# Patient Record
Sex: Male | Born: 1995 | Race: Black or African American | Hispanic: No | Marital: Single | Smoking: Current every day smoker
Health system: Southern US, Community
[De-identification: ages and names within clinical notes are randomized; demographics above are authoritative.]

## PROBLEM LIST (undated history)

## (undated) DIAGNOSIS — R569 Unspecified convulsions: Secondary | ICD-10-CM

## (undated) DIAGNOSIS — B009 Herpesviral infection, unspecified: Secondary | ICD-10-CM

---
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
## 2015-12-24 ENCOUNTER — Other Ambulatory Visit: Payer: Self-pay

## 2015-12-24 ENCOUNTER — Emergency Department (HOSPITAL_COMMUNITY): Payer: Managed Care, Other (non HMO)

## 2015-12-24 ENCOUNTER — Emergency Department (HOSPITAL_COMMUNITY)
Admission: EM | Admit: 2015-12-24 | Discharge: 2015-12-24 | Discharge status: Against Medical Advice | Payer: Medicaid Other | Attending: Emergency Medicine | Admitting: Emergency Medicine

## 2015-12-24 ENCOUNTER — Emergency Department (HOSPITAL_COMMUNITY)
Admission: EM | Admit: 2015-12-24 | Discharge: 2015-12-25 | Disposition: A | Discharge status: Home / Self Care | Payer: Managed Care, Other (non HMO) | Attending: Emergency Medicine | Admitting: Emergency Medicine

## 2015-12-24 ENCOUNTER — Encounter (HOSPITAL_COMMUNITY): Payer: Self-pay | Admitting: Emergency Medicine

## 2015-12-24 DIAGNOSIS — Y9389 Activity, other specified: Secondary | ICD-10-CM | POA: Insufficient documentation

## 2015-12-24 DIAGNOSIS — Y92009 Unspecified place in unspecified non-institutional (private) residence as the place of occurrence of the external cause: Secondary | ICD-10-CM | POA: Diagnosis not present

## 2015-12-24 DIAGNOSIS — Y998 Other external cause status: Secondary | ICD-10-CM | POA: Diagnosis not present

## 2015-12-24 DIAGNOSIS — Z880 Allergy status to penicillin: Secondary | ICD-10-CM | POA: Insufficient documentation

## 2015-12-24 DIAGNOSIS — X58XXXA Exposure to other specified factors, initial encounter: Secondary | ICD-10-CM | POA: Diagnosis not present

## 2015-12-24 DIAGNOSIS — Y92000 Kitchen of unspecified non-institutional (private) residence as  the place of occurrence of the external cause: Secondary | ICD-10-CM | POA: Insufficient documentation

## 2015-12-24 DIAGNOSIS — R569 Unspecified convulsions: Secondary | ICD-10-CM | POA: Insufficient documentation

## 2015-12-24 DIAGNOSIS — F17210 Nicotine dependence, cigarettes, uncomplicated: Secondary | ICD-10-CM | POA: Insufficient documentation

## 2015-12-24 DIAGNOSIS — Z79899 Other long term (current) drug therapy: Secondary | ICD-10-CM | POA: Insufficient documentation

## 2015-12-24 DIAGNOSIS — W01198A Fall on same level from slipping, tripping and stumbling with subsequent striking against other object, initial encounter: Secondary | ICD-10-CM | POA: Diagnosis not present

## 2015-12-24 DIAGNOSIS — S060X1A Concussion with loss of consciousness of 30 minutes or less, initial encounter: Secondary | ICD-10-CM | POA: Diagnosis not present

## 2015-12-24 DIAGNOSIS — F1210 Cannabis abuse, uncomplicated: Secondary | ICD-10-CM | POA: Diagnosis not present

## 2015-12-24 DIAGNOSIS — S0990XA Unspecified injury of head, initial encounter: Secondary | ICD-10-CM | POA: Insufficient documentation

## 2015-12-24 DIAGNOSIS — R55 Syncope and collapse: Secondary | ICD-10-CM | POA: Diagnosis present

## 2015-12-24 DIAGNOSIS — R4020 Unspecified coma: Secondary | ICD-10-CM

## 2015-12-24 LAB — URINALYSIS, ROUTINE W REFLEX MICROSCOPIC
Bilirubin Urine: NEGATIVE
GLUCOSE, UA: NEGATIVE mg/dL
HGB URINE DIPSTICK: NEGATIVE
Ketones, ur: NEGATIVE mg/dL
Leukocytes, UA: NEGATIVE
Nitrite: NEGATIVE
PH: 7.5 (ref 5.0–8.0)
Protein, ur: NEGATIVE mg/dL
SPECIFIC GRAVITY, URINE: 1.017 (ref 1.005–1.030)

## 2015-12-24 LAB — BASIC METABOLIC PANEL
Anion gap: 10 (ref 5–15)
BUN: 17 mg/dL (ref 6–20)
CO2: 29 mmol/L (ref 22–32)
Calcium: 9.5 mg/dL (ref 8.9–10.3)
Chloride: 101 mmol/L (ref 101–111)
Creatinine, Ser: 0.88 mg/dL (ref 0.61–1.24)
GFR calc Af Amer: >60 mL/min (ref >60)
GFR calc non Af Amer: >60 mL/min (ref >60)
Glucose, Bld: 103 mg/dL — ABNORMAL HIGH (ref 65–99)
Potassium: 4.2 mmol/L (ref 3.5–5.1)
Sodium: 140 mmol/L (ref 135–145)

## 2015-12-24 LAB — RAPID URINE DRUG SCREEN, HOSP PERFORMED
AMPHETAMINES: NOT DETECTED
Barbiturates: NOT DETECTED
Benzodiazepines: NOT DETECTED
Cocaine: NOT DETECTED
OPIATES: NOT DETECTED
TETRAHYDROCANNABINOL: POSITIVE — AB

## 2015-12-24 LAB — CBC
HEMATOCRIT: 48.9 % (ref 39.0–52.0)
HEMOGLOBIN: 16.2 g/dL (ref 13.0–17.0)
MCH: 33.6 pg (ref 26.0–34.0)
MCHC: 33.1 g/dL (ref 30.0–36.0)
MCV: 101.5 fL — AB (ref 78.0–100.0)
Platelets: 160 10*3/uL (ref 150–400)
RBC: 4.82 MIL/uL (ref 4.22–5.81)
RDW: 11.8 % (ref 11.5–15.5)
WBC: 9.3 10*3/uL (ref 4.0–10.5)

## 2015-12-24 LAB — ETHANOL

## 2015-12-24 NOTE — ED Notes (Addendum)
Per EMS: Pt sisters called 911.  Pt was arguing with his girlfriend.  Said he "couldn't see".  They heard a thump and saw him lying on the floor in the kitchen with his eyes closed.  Pt states he had a headache before he fell.  Pt had a purposeful response upon EMS arrival, even though he was not talking.  Orthostatics were negative.  Pt was ambulatory to stretcher.

## 2015-12-24 NOTE — ED Provider Notes (Signed)
CSN: [id number]     Arrival date & time 12/24/15  1735 History   First MD Initiated Contact with Patient 12/24/15 2045     Chief Complaint  Patient presents with  . Loss of Consciousness     (Consider location/radiation/quality/duration/timing/severity/associated sxs/prior Treatment) HPI   Patient is a 20 year old male with no past medical history presents to the ED via EMS is status post syncopal episode. Patient reports he was at home on the phone when he began to have a right-sided headache and notes he started to have white spots in his vision and then felt lightheaded. Patient's sister was present when this is going on and she reported to the patient's mother that she saw the patient fall, hit his head and then began shaking his head and arms for approximately 15 minutes. During this time patient was unresponsive resulting in his sister calling EMS. On EMS arrival he had purposeful response but they report he was not verbal. Patient states the next thing he remembered is waking up in the ambulance and not knowing what happened. Patient denies history of similar episodes. He reports having a continued left-sided headache that he describes as a constant throbbing pain, denies any aggravating or alleviating factors. Endorses associated nausea and intermittent lightheadedness. He notes his vision has since returned to baseline. Patient states he has had migraines in the past and notes that this headache feels similar to migraines he has had before. Denies fever, chills, photophobia, dizziness, cough, difficulty breathing, chest pain, palpitations, abdominal pain, vomiting, diarrhea, urinary symptoms, numbness, tingling, weakness, neck stiffness, neck/back pain. Patient endorses smoking marijuana but reports he last smoked a few weeks ago. He also endorses drinking alcohol occasionally, denies any other drug use. Denies taking any medications at home. Denies history of seizures.  No past medical history  on file. No past surgical history on file. No family history on file. Social History  Substance Use Topics  . Smoking status: Current Some Day Smoker    Types: Cigarettes  . Smokeless tobacco: Not on file  . Alcohol Use: Yes     Comment: "Occasionally," up to 1-2 times every couple of months    Review of Systems  Gastrointestinal: Positive for nausea.  Neurological: Positive for seizures and headaches.      Allergies  Amoxicillin; Penicillins; and Sulfa antibiotics  Home Medications   Prior to Admission medications   Medication Sig Start Date End Date Taking? Authorizing Provider  diphenhydrAMINE (BENADRYL ALLERGY) 25 mg capsule Take 25 mg by mouth every 6 (six) hours as needed for itching or allergies.    Historical Provider, MD  OVER THE COUNTER MEDICATION Take 1 tablet by mouth daily. Store brand allergy relief    Historical Provider, MD   BP 124/77 mmHg  Pulse 69  Temp(Src) 97.8 F (36.6 C) (Oral)  Resp 19  SpO2 100% Physical Exam  Constitutional: He is oriented to person, place, and time. He appears well-developed and well-nourished. No distress.  HENT:  Head: Normocephalic and atraumatic. Head is without raccoon's eyes, without Battle's sign, without abrasion, without contusion and without laceration.  Right Ear: Tympanic membrane normal. No hemotympanum.  Left Ear: Tympanic membrane normal. No hemotympanum.  Nose: Nose normal. No septal deviation. Right sinus exhibits no maxillary sinus tenderness and no frontal sinus tenderness. Left sinus exhibits no maxillary sinus tenderness and no frontal sinus tenderness.  Mouth/Throat: Uvula is midline, oropharynx is clear and moist and mucous membranes are normal. No oropharyngeal exudate.  No bite marks  on tongue.  Eyes: Conjunctivae and EOM are normal. Pupils are equal, round, and reactive to light. Right eye exhibits no discharge. Left eye exhibits no discharge. No scleral icterus.  Neck: Normal range of motion. Neck  supple.  Cardiovascular: Normal rate, regular rhythm, normal heart sounds and intact distal pulses.   Pulmonary/Chest: Effort normal and breath sounds normal. No respiratory distress. He has no wheezes. He has no rales. He exhibits no tenderness.  Abdominal: Soft. Bowel sounds are normal. He exhibits no distension and no mass. There is no tenderness. There is no rebound and no guarding.  Musculoskeletal: Normal range of motion. He exhibits no edema or tenderness.  Lymphadenopathy:    He has no cervical adenopathy.  Neurological: He is alert and oriented to person, place, and time. He has normal strength and normal reflexes. No cranial nerve deficit or sensory deficit. He displays a negative Romberg sign. Coordination and gait normal.  Skin: Skin is warm and dry. He is not diaphoretic.  Nursing note and vitals reviewed.   ED Course  Procedures (including critical care time) Labs Review Labs Reviewed  BASIC METABOLIC PANEL - Abnormal; Notable for the following:    Glucose, Bld 103 (*)    All other components within normal limits  CBC - Abnormal; Notable for the following:    MCV 101.5 (*)    All other components within normal limits  URINE RAPID DRUG SCREEN, HOSP PERFORMED - Abnormal; Notable for the following:    Tetrahydrocannabinol POSITIVE (*)    All other components within normal limits  URINALYSIS, ROUTINE W REFLEX MICROSCOPIC (NOT AT [facility])  ETHANOL    Imaging Review Ct Head Wo Contrast  12/24/2015  CLINICAL DATA:  Pt was arguing with his girlfriend. Said he "couldn't see". They heard a thump and saw him lying on the floor in the kitchen with his eyes closed. Pt states he had a headache before he fell. Pt had a purposeful response upon .*comment was truncated* EXAM: CT HEAD WITHOUT CONTRAST TECHNIQUE: Contiguous axial images were obtained from the base of the skull through the vertex without intravenous contrast. COMPARISON:  None. FINDINGS: No mass lesion. No midline shift. No  acute hemorrhage or hematoma. No extra-axial fluid collections. No evidence of acute infarction. Calvarium intact. Visualized portions of the sinuses clear. IMPRESSION: Normal head CT Electronically Signed   By: Esperanza Heir M.D.   On: 12/24/2015 21:57   I have personally reviewed and evaluated these images and lab results as part of my medical decision-making.   EKG Interpretation   Date/Time:  Friday December 24 2015 17:01:26 EST Ventricular Rate:  83 PR Interval:  169 QRS Duration: 98 QT Interval:  344 QTC Calculation: 404 R Axis:   102 Text Interpretation:  Sinus rhythm Borderline right axis deviation ST  elev, probable normal early repol pattern ED PHYSICIAN INTERPRETATION  AVAILABLE IN [facility] Confirmed by TEST, Record ([id number]) on 12/25/2015  9:45:57 AM      MDM   Final diagnoses:  LOC (loss of consciousness)    Patient presents with episode of witnessed LOC by sister. Patient's sister reports she saw him on the ground shaking and was unresponsive for approximately 15 minutes. Denies history of seizures. VSS.  exam unremarkable, no evidence of injury/trauma. No neuro deficits. EKG revealed sinus rhythm with borderline right axis deviation and ST elevation likely due to normal early repolarization.  labs unremarkable.  UDS positive for tetrahydrocannabinol. CT head negative. On reexamination patient able to stand and ambulate  without assistance, no ataxia noted. I feel that pt is appropriate to be d/c home and follow up with neurology out pt. Orders placed for outpatient neuro follow up.   Evaluation does not show pathology requring ongoing emergent intervention or admission. Pt is hemodynamically stable and mentating appropriately. Discussed findings/results and plan with patient/guardian, who agrees with plan. All questions answered. Return precautions discussed and outpatient follow up given.    Satira Sark [place], [place] 12/27/15 0115  Marily Memos, MD 12/29/15  [phone]

## 2015-12-24 NOTE — ED Notes (Signed)
Pt just left AMA about 30 mins ago.  Was brought back by his family to be examined.  They report a syncopal episode after pt argued with his girlfriend on the phone.  Pt c/o headache.  Pt was ambulatory to triage and is tearful d/t fear.

## 2015-12-24 NOTE — ED Notes (Signed)
[facility]

## 2015-12-24 NOTE — ED Notes (Signed)
Pt states "he's ready to get the fuck up outta here." Pt showed the door.

## 2015-12-24 NOTE — Discharge Instructions (Signed)
Please follow up with a primary care provider from the Resource Guide provided below in 2-3 days. Please return to the Emergency Department if symptoms worsen or new onset of fever, chills, headache, visual changes, lightheadedness, dizziness, difficulty breathing, chest pain, abdominal pain, vomiting, numbness, tingling, weakness, seizure, syncope.    Emergency Department Resource Guide 1) Find a Doctor and Pay Out of Pocket Although you won't have to find out who is covered by your insurance plan, it is a good idea to ask around and get recommendations. You will then need to call the office and see if the doctor you have chosen will accept you as a new patient and what types of options they offer for patients who are self-pay. Some doctors offer discounts or will set up payment plans for their patients who do not have insurance, but you will need to ask so you aren't surprised when you get to your appointment.  2) Contact Your Local Health Department Not all health departments have doctors that can see patients for sick visits, but many do, so it is worth a call to see if yours does. If you don't know where your local health department is, you can check in your phone book. The CDC also has a tool to help you locate your state's health department, and many state websites also have listings of all of their local health departments.  3) Find a Walk-in Clinic If your illness is not likely to be very severe or complicated, you may want to try a walk in clinic. These are popping up all over the country in pharmacies, drugstores, and shopping centers. They're usually staffed by nurse practitioners or physician assistants that have been trained to treat common illnesses and complaints. They're usually fairly quick and inexpensive. However, if you have serious medical issues or chronic medical problems, these are probably not your best option.  No Primary Care Doctor: - Call Health Connect at  [phone] -  they can help you locate a primary care doctor that  accepts your insurance, provides certain services, etc. - Physician Referral Service- [phone]  Chronic Pain Problems: Organization         Address  Phone   Notes  [facility]  [phone] Patients need to be referred by their primary care doctor.   Medication Assistance: Organization         Address  Phone   Notes  [facility] Medication [facility] [address] [place], [place] [address] [phone] --Must be a resident of [facility] -- Must have NO insurance coverage whatsoever (no Medicaid/ Medicare, etc.) -- The pt. MUST have a primary care doctor that directs their care regularly and follows them in the community   [facility]ist  [phone]   [employer]  [phone]    Agencies that provide inexpensive medical care: Organization         Address  Phone   Notes  [facility]  [phone]   [facility]    [phone]   [facility] [address] [place], [place] [address] [phone]   [facility] [address] [place] [place] [phone]   Planned Parenthood    [phone]   [facility]    [phone]   [facility] and [facility]  [address], [place] Phone:  [phone], Fax:  [phone] Hours of Operation:  9  am - 6 pm, M-F.  Also accepts Medicaid/Medicare and self-pay.  [facility]  [address], [place] Phone: [phone]) [phone], Fax: [phone]. Hours of Operation:  8:30 am - 5:30 pm, M-F.  Also accepts Medicaid and self-pay.  [facility] [place] [address], [place] Phone: [phone]) [phone]   [facility] [address], [place], [place] [phone], Ext. [phone] Mondays & Thursdays: 7-9 AM.  First 15 patients are seen on a first come, first serve basis.    Medicaid-accepting  [facility] Providers:  Organization         Address  Phone   Notes  [facility] [address], [place] [phone] Also accepts self-pay patients.  [facility] [address], [place]  [phone]) [phone]   [facility] [address], [place] [phone]) [phone]   [facility] [address] [place] [address], [place] [phone]   Renaye RakersVeita Bland [address], [place]   [phone]) [phone] Only accepts [place] Access [place] patients after they have their name applied to their card.   Self-Pay (no insurance) in [facility]:  Organization         Address  Phone   Notes  Sickle Cell Patients, [facility] [address], [place] [phone]) [phone]   [facility] [address], [place] [phone]) [phone]   [facility]  [address], [place] [phone]   [facility]/Dr. Osei-Bonsu  [address] [place] [address], [place] or [phone], [place] [phone]) [phone] Phone number for both [place] and [place] locations is the same.  [facility] [address], [place] [phone]) [phone]   [facility] [address] [place] [address], [place] or [address] [phone]   [facility] [address], [place] [phone]) [phone], phone; [phone](336) [phone], fax Sees patients 1st and 3rd Saturday of every month.  Must not qualify for public or private insurance (i.e. Medicaid, Medicare, [place] Health Choice, Veterans' Benefits)  Household income should be no more than 200% of the poverty level The clinic cannot treat you if you are pregnant or think you are pregnant  Sexually transmitted diseases are not treated at the clinic.    Dental Care: Organization         Address  Phone  Notes  [facility] Department of [facility] [address], [place] [phone] Accepts children up to age 21 who are enrolled in [place] or [place] Health Choice; pregnant women with a Medicaid card; and children who have applied for Medicaid or [place] Health Choice, but were declined, whose parents can pay a reduced fee at time of service.  [facility] of [facility] [place]  [address], [place] [phone] Accepts children up to age 721 who are enrolled in [place] or  Health Choice; pregnant women with a Medicaid card; and children who have applied for Medicaid or  Health Choice, but were declined, whose parents can pay a reduced fee at time of service.  [facility]  [address], [place] [phone]) [phone] Patients are seen by appointment only. Walk-ins are not accepted. [facility] will see patients 18 years of  age and older. Monday - Tuesday (8am-5pm) Most Wednesdays (8:30-5pm) $30 per visit, cash only  [facility] Adult [facility] Access PRO[facility]  [address], [facility] [phone] Patients are seen by appointment only. Walk-ins are not accepted. [facility] will see patients 38 years of age and older. One Wednesday Evening (Monthly: Volunteer Based).  $30 per visit, cash only  [employer]  [phone] for adults; Children under age 68, call Graduate Pediatric Dentistry at [phone]. Children aged 52-14, please call [phone] to request a pediatric application.  Dental services are provided in all areas of dental care including fillings, crowns and bridges, complete and partial dentures, implants, gum treatment, root canals, and extractions. Preventive care is also provided. Treatment is provided to both adults and children. Patients are selected via a lottery and there is often a waiting list.   [facility] [address] [place]  [phone] [email or web address]   [facility] [address] [place], [place] [phone], Ext. [phone] Second and Fourth Thursday of each month, opens at 6:30 AM; Clinic ends at 9 AM.  Patients are seen on a first-come first-served basis, and a limited number are seen during each clinic.   [facility]  [address] [place], [place] [phone]   Eligibility Requirements You must have lived in [place], [place], or [place] counties for at least the last three months.   You cannot be eligible for state or federal sponsored [place], including [employer], [place], or [employer].   You generally cannot be eligible for healthcare insurance through your employer.    How to apply: Eligibility screenings are held every Tuesday and Wednesday afternoon from 1:00 pm until 4:00 pm. You do not need an appointment for the interview!  [facility] [address] [place], [place] [phone]   [facility] Health Department  [phone]   [facility] Health Department  [phone]   [facility] Health Department  [phone]    Behavioral Health Resources in the Community: Intensive Outpatient Programs Organization         Address  Phone  Notes  [facility] [address] [place], [place] [phone]   [facility]patient [address], [place], [place] [phone]   [facility]: [facility] [address] [place], [place]  [phone]   [facility] [address],  [place], [place] [phone] or [phone]   Substance Abuse Resources Organization         Address  Phone  Notes  Alcohol [facility] Drug Services  [phone]   [facility]  [phone]   [facility] [place]  [phone]   Floydene Flock  [phone]   Residential & Outpatient Substance Abuse Program  [phone]   Psychological Services Organization         Address  Phone  Notes  [facility]   [phone]   [facility]  [phone]   [facility] [address], [place] [phone] or [phone]    Mobile Crisis Teams Organization         Address  Phone  Notes  [facility], Mobile Crisis Care Unit  [phone]   [facility]  [address] [place], [place] [phone]   [facility] [address] [place] [phone]    Self-Help/Support Groups Organization  Address  Phone             Notes  [place] of [place] - variety of support groups  [place] Call for more information  Narcotics Anonymous (NA), [facility] [address], [employer] [place]  2 meetings at this location   Special educational needs teacher         Address  Phone  Notes  [facility] [place],    [place]  [phone]   [facility]  [address] [place] [phone], [place], [place]   [place], [place] [phone] Admissions: 8am-3pm M-F  Incentives Substance [place] [address],    [place], [place] [phone]   [facility] [address] [place], [place], [place]   [facility] Alejandro Otero Lopez [address]  [place], [place]   [facility] - Intensive Outpatient [address], [place], [place]   [facility] [address]  [place], [place] [phone] or [phone]   [facility] ([facility]) [address] [place], [place] Accepts Medicaid  [facility] [address]  [place] [phone] Substance Abuse/Addiction Treatment   [facility] Organization         Address  Phone  Notes  [facility]  [phone]   Domenic Schwab, PhD [address] Arlis Porta [place], [place]   [phone] or  [phone]   [place], [place] [phone]   [facility] [address] [place], [place] [phone] Insurance/Medicaid/sponsorship through [facility] and Families [address], [address] [place]                                    [place], [place] [phone] [place] [address], [place] [phone]    Dr. Adele Schilder  [phone]   [facility] Dept. 1) [address], [place] 2) [place] 3)  [place] [address], [place] [phone]  [phone]   [place] [phone] or [phone] (After Hours)

## 2016-01-24 ENCOUNTER — Ambulatory Visit (INDEPENDENT_AMBULATORY_CARE_PROVIDER_SITE_OTHER): Payer: Managed Care, Other (non HMO) | Admitting: Neurology

## 2016-01-24 ENCOUNTER — Encounter: Payer: Self-pay | Admitting: Neurology

## 2016-01-24 VITALS — BP 120/64 | HR 85 | Ht 68.0 in | Wt 172.0 lb

## 2016-01-24 DIAGNOSIS — G40001 Localization-related (focal) (partial) idiopathic epilepsy and epileptic syndromes with seizures of localized onset, not intractable, with status epilepticus: Secondary | ICD-10-CM

## 2016-01-24 DIAGNOSIS — G43109 Migraine with aura, not intractable, without status migrainosus: Secondary | ICD-10-CM

## 2016-01-24 MED ORDER — LAMOTRIGINE ER 25 MG PO TB24: ORAL_TABLET | ORAL | Status: DC

## 2016-01-24 NOTE — Patient Instructions (Signed)
1.  Lamotrigine XR (Lamictal)  Week 1&2:  daily Week 3&4:  daily Week 5:      daily  At the beginning of week 5, call the clinic, so we can call in a prescription to further increase the dose to an appropriate level.  If you experience any new or unusual rash while taking this medication, call the clinic immediately.  There is a small risk of suicidal ideation, which is the case for all antiepileptic medications.  2.  No driving.  As per [place] law, you should not drive for 6 months since last seizure. 3.  MRI of brain with and without contrast with seizure protocol 4.  Sleep deprived EEG 5.  Follow up in 6 months.

## 2016-01-24 NOTE — [place] Notes (Signed)
Chart forwarded.  

## 2016-01-24 NOTE — [place] Notes (Signed)
NEUROLOGY CONSULTATION NOTE  Walter Sparks MRN: [id number] DOB: October 31, 1996  Referring provider: Marily Memos, MD (ED referral) Primary care provider: Rodrigo Ran, MD  Reason for consult:  Seizure, loss of consciousness  HISTORY OF PRESENT ILLNESS: Walter Sparks is a 20 year old right-handed male who presents for possible seizure.  History obtained by patient and ED note.  Labs and imaging of head CT reviewed.  On 12/24/15, he was at home on the phone, when he developed sudden right sided headache and saw bright tunnel vision, followed by lightheadedness and chest tightness.  The patient then fell, hitting his head and he began shaking his head and arms on the floor for about 10 minutes.  He did not have incontinence but said he did bite the side of his tongue without blood.  This was witnessed by his sister and mother.  He vaguely remembers EMS walking him into the ambulance but didn't fully wake up until inside the ambulance.  [facility].  He reported possible sleep deprivation.  CT of head was normal.  Urine drug screen was positive for THC.  Ethanol was negative.  CBC and BMP were unremarkable.  EKG showed sinus rhythm.  He was discharged with outpatient neurology follow up.  He reports two other episodes of loss of consciousness, but they were more brief, about 2 to 3 minutes, and he did not have prolonged lethargy afterwards. He sometimes has right sided headache with transient blurred vision in the right eye.  He reports no known history of seizures.  He had an uncomplicated vaginal birth.  He denies history of febrile seizures or meningitis or encephalitis.  He had a concussion with loss of consciousness once while playing football.  There is no known family history of seizures.  PAST MEDICAL HISTORY: History reviewed. No pertinent past medical history.  PAST SURGICAL HISTORY: History reviewed. No pertinent past surgical history.  MEDICATIONS: Current  Outpatient Prescriptions on File Prior to Visit  Medication Sig Dispense Refill  . diphenhydrAMINE (BENADRYL ALLERGY) 25 mg capsule Take 25 mg by mouth every 6 (six) hours as needed for itching or allergies.     No current facility-administered medications on file prior to visit.    ALLERGIES: Allergies  Allergen Reactions  . Amoxicillin Hives  . Penicillins Hives  . Sulfa Antibiotics Hives    FAMILY HISTORY: History reviewed. No pertinent family history.  SOCIAL HISTORY: Social History   Social History  . Marital Status: Single    Spouse Name: N/A  . Number of Children: N/A  . Years of Education: N/A   Occupational History  . Not on file.   Social History Main Topics  . Smoking status: Current Some Day Smoker    Types: Cigarettes  . Smokeless tobacco: Not on file  . Alcohol Use: Yes     Comment: "Occasionally," up to 1-2 times every couple of months  . Drug Use: 7.00 per week    Special: Marijuana     Comment: Daily marijuana use with tobacco  . Sexual Activity:    Partners: Female     Comment: Uses condoms inconsistently   Other Topics Concern  . Not on file   Social History Narrative    REVIEW OF SYSTEMS: Constitutional: No fevers, chills, or sweats, no generalized fatigue, change in appetite Eyes: No visual changes, double vision, eye pain Ear, nose and throat: No hearing loss, ear pain, nasal congestion, sore throat Cardiovascular: No chest pain, palpitations Respiratory:  No shortness of breath at rest or with exertion, wheezes GastrointestinaI: No nausea, vomiting, diarrhea, abdominal pain, fecal incontinence Genitourinary:  No dysuria, urinary retention or frequency Musculoskeletal:  No neck pain, back pain Integumentary: No rash, pruritus, skin lesions Neurological: as above Psychiatric: No depression, insomnia, anxiety Endocrine: No palpitations, fatigue, diaphoresis, mood swings, change in appetite, change in weight, increased  thirst Hematologic/Lymphatic:  No anemia, purpura, petechiae. Allergic/Immunologic: no itchy/runny eyes, nasal congestion, recent allergic reactions, rashes  PHYSICAL EXAM: Filed Vitals:   01/24/16 0858  BP: 120/64  Pulse: 85   General: No acute distress.  Patient appears well-groomed.  Head:  Normocephalic/atraumatic Eyes:  fundi unremarkable, without vessel changes, exudates, hemorrhages or papilledema. Neck: supple, no paraspinal tenderness, full range of motion Back: No paraspinal tenderness Heart: regular rate and rhythm Lungs: Clear to auscultation bilaterally. Vascular: No carotid bruits. Neurological Exam: Mental status: alert and oriented to person, place, and time, recent and remote memory intact, fund of knowledge intact, attention and concentration intact, speech fluent and not dysarthric, language intact. Cranial nerves: CN I: not tested CN II: pupils equal, round and reactive to light, visual fields intact, fundi unremarkable, without vessel changes, exudates, hemorrhages or papilledema. CN III, IV, VI:  full range of motion, no nystagmus, no ptosis CN V: facial sensation intact CN VII: upper and lower face symmetric CN VIII: hearing intact CN IX, X: gag intact, uvula midline CN XI: sternocleidomastoid and trapezius muscles intact CN XII: tongue midline Bulk & Tone: normal, no fasciculations. Motor:  5/5 throughout Sensation:  Pinprick and vibration sensation intact. Deep Tendon Reflexes:  2+ throughout, toes downgoing.  Finger to nose testing:  Without dysmetria.  Heel to shin:  Without dysmetria.  Gait:  Normal station and stride.  Able to turn and tandem walk. Romberg negative.  IMPRESSION: With prolonged loss of consciousness and  lethargy afterwards, I favor seizure over convulsive syncope. He probably has migraine with aura as well  PLAN: 1.  Will initiate Lamictal XR titration.  He will call in 5 weeks for refill or continued titration 2.  Discussed [place]  law - no driving for 6 months from time of seizure 3.  MRI of brain with and without contrast with seizure protocol 4.  Sleep deprived EEG 5.  Follow up in 5 months.  Thank you for allowing me to take part in the care of this patient.  Shon Millet, DO  CC:  Rodrigo Ran, MD

## 2016-02-01 ENCOUNTER — Ambulatory Visit (HOSPITAL_COMMUNITY): Admission: RE | Admit: 2016-02-01 | Payer: Managed Care, Other (non HMO) | Source: Ambulatory Visit

## 2016-02-03 ENCOUNTER — Other Ambulatory Visit: Payer: Managed Care, Other (non HMO) | Admitting: Neurology

## 2016-06-22 ENCOUNTER — Ambulatory Visit: Payer: Managed Care, Other (non HMO) | Admitting: Neurology

## 2016-06-27 ENCOUNTER — Ambulatory Visit: Payer: Managed Care, Other (non HMO) | Admitting: Neurology

## 2016-06-30 ENCOUNTER — Encounter: Payer: Self-pay | Admitting: Neurology

## 2016-07-05 ENCOUNTER — Telehealth: Payer: Self-pay | Admitting: Neurology

## 2016-07-05 NOTE — Telephone Encounter (Signed)
Dismiss[facility] from [facility] by Alma Downsr.Adam Jaffe 06/30/16.fbg

## 2016-07-11 NOTE — Telephone Encounter (Signed)
Received signed domestic return receipt verifying delivery of certified letter on July 06, 2016. Article number [phone] [phone] [id number]

## 2016-09-15 ENCOUNTER — Emergency Department (HOSPITAL_COMMUNITY)
Admission: EM | Admit: 2016-09-15 | Discharge: 2016-09-15 | Disposition: A | Discharge status: Home / Self Care | Payer: Medicaid Other | Attending: Emergency Medicine | Admitting: Emergency Medicine

## 2016-09-15 ENCOUNTER — Emergency Department (HOSPITAL_COMMUNITY): Payer: Medicaid Other

## 2016-09-15 ENCOUNTER — Encounter (HOSPITAL_COMMUNITY): Payer: Self-pay | Admitting: *Deleted

## 2016-09-15 DIAGNOSIS — B9789 Other viral agents as the cause of diseases classified elsewhere: Secondary | ICD-10-CM

## 2016-09-15 DIAGNOSIS — F17210 Nicotine dependence, cigarettes, uncomplicated: Secondary | ICD-10-CM | POA: Insufficient documentation

## 2016-09-15 DIAGNOSIS — R05 Cough: Secondary | ICD-10-CM | POA: Diagnosis present

## 2016-09-15 DIAGNOSIS — J069 Acute upper respiratory infection, unspecified: Secondary | ICD-10-CM | POA: Diagnosis not present

## 2016-09-15 MED ORDER — PROMETHAZINE-DM 6.25-15 MG/5ML PO SYRP: 5.0000 mL | ORAL_SOLUTION | Freq: Four times a day (QID) | ORAL | 0 refills | Status: DC | PRN

## 2016-09-15 MED ORDER — GUAIFENESIN 100 MG/5ML PO LIQD: 100.0000 mg | ORAL | 0 refills | Status: DC | PRN

## 2016-09-15 NOTE — ED Provider Notes (Signed)
[facility] Provider Note   CSN: [id number] Arrival date & time: 09/15/16  1443  By signing my name below, I, Walter Sparks, attest that this documentation has been prepared under the direction and in the presence of non-physician practitioner, Fayrene HelperBowie Will Heinkel, PA-C. Electronically Signed: Majel HomerPeyton Sparks, Scribe. 09/15/2016. 4:29 PM.  History   Chief Complaint Chief Complaint  Patient presents with  . Cough   The history is provided by the patient. No language interpreter was used.   HPI Comments: Walter Sparks is a 20 y.o. male who presents to the Emergency Department complaining of gradually worsening, productive cough and sore throat that began 3 days ago and worsened today. Pt reports his cough is worse in the morning after waking up and is accompanied with shortness of breath. He notes associated rhinorrhea, congestion, sneezing and diaphoresis. Pt states he has been drinking hot tea but has not taken any medication to relieve his symptoms. He deneis fever, recent surgery, hx of PE or asthma, recent long travel, calf pain and leg swelling. No environmental changes.  Pt believes blood in his sputum is likely due to prolonged coughing.  History reviewed. No pertinent past medical history.  Patient Active Problem List   Diagnosis Date Noted  . Localization-related idiopathic epilepsy and epileptic syndromes with seizures of localized onset, not intractable, with status epilepticus (HCC) 01/24/2016  . Possible exposure to STD 10/08/2013  . Tobacco abuse counseling 10/08/2013  . ATTENTION DEFICIT DISORDER 07/07/2008    History reviewed. No pertinent surgical history.   Home Medications    Prior to Admission medications   Medication Sig Start Date End Date Taking? Authorizing Provider  diphenhydrAMINE (BENADRYL ALLERGY) 25 mg capsule Take 25 mg by mouth every 6 (six) hours as needed for itching or allergies.    Historical Provider, MD  LamoTRIgine (LAMICTAL XR) 25 MG TB24 tablet Week  1 &25: 25 mg daily, Week 3&4: 50 mg daily, Week 5: 100 mg 01/24/16   Drema DallasAdam R Jaffe, DO    Family History No family history on file.  Social History Social History  Substance Use Topics  . Smoking status: Current Some Day Smoker    Types: Cigarettes  . Smokeless tobacco: Never Used  . Alcohol use Yes     Comment: "Occasionally," up to 1-2 times every couple of months     Allergies   Amoxicillin; Penicillins; and Sulfa antibiotics   Review of Systems Review of Systems  Constitutional: Positive for diaphoresis. Negative for fever.  HENT: Positive for congestion, rhinorrhea, sneezing and sore throat.   Respiratory: Positive for cough and shortness of breath.   Cardiovascular: Negative for leg swelling.   Physical Exam Updated Vital Signs BP 147/71 (BP Location: Left Arm)   Pulse 88   Temp 98.7 F (37.1 C) (Oral)   Resp 17   Ht 5\' 11"  (1.803 m)   Wt 170 lb (77.1 kg)   SpO2 96%   BMI 23.71 kg/m   Physical Exam  Constitutional: He is oriented to person, place, and time. He appears well-developed and well-nourished.  HENT:  Head: Normocephalic and atraumatic.  Mild rhinorrhea, throat is clear.   Eyes: EOM are normal.  Neck: Normal range of motion.  Cardiovascular: Normal rate, regular rhythm, normal heart sounds and intact distal pulses.   Pulmonary/Chest: Effort normal and breath sounds normal. No respiratory distress.  Abdominal: Soft. He exhibits no distension. There is no tenderness.  Musculoskeletal: Normal range of motion.  BLE: no palpable cords, erythema, or edema  Neurological: He is alert and oriented to person, place, and time.  Skin: Skin is warm and dry.  Psychiatric: He has a normal mood and affect. Judgment normal.  Nursing note and vitals reviewed.  ED Treatments / Results  Labs (all labs ordered are listed, but only abnormal results are displayed) Labs Reviewed - No data to display  EKG  EKG Interpretation None       Radiology Dg Chest 2  View  Result Date: 09/15/2016 CLINICAL DATA:  Chest pain and shortness of breath.  Tobacco use. EXAM: CHEST  2 VIEW COMPARISON:  None. FINDINGS: Lungs are clear. Heart size and pulmonary vascularity are normal. No adenopathy. No pneumothorax. No bone lesions. IMPRESSION: No abnormality noted. Electronically Signed   By: Bretta Bang III M.D.   On: 09/15/2016 16:30   Procedures Procedures (including critical care time)  Medications Ordered in ED Medications - No data to display  DIAGNOSTIC STUDIES:  Oxygen Saturation is 96% on RA, normal by my interpretation.    COORDINATION OF CARE:  4:29 PM Discussed treatment plan with pt at bedside and pt agreed to plan.  Initial Impression / Assessment and Plan / ED Course  I have reviewed the triage vital signs and the nursing notes.  Pertinent labs & imaging results that were available during my care of the patient were reviewed by me and considered in my medical decision making (see chart for details).  Clinical Course    BP 147/71 (BP Location: Left Arm)   Pulse 88   Temp 98.7 F (37.1 C) (Oral)   Resp 17   Ht 5\' 11"  (1.803 m)   Wt 77.1 kg   SpO2 96%   BMI 23.71 kg/m    I personally performed the services described in this documentation, which was scribed in my presence. The recorded information has been reviewed and is accurate.   Final Clinical Impressions(s) / ED Diagnoses   Final diagnoses:  Viral URI with cough    New Prescriptions New Prescriptions   GUAIFENESIN (ROBITUSSIN) 100 MG/5ML LIQUID    Take 5-10 mLs (100-200 mg total) by mouth every 4 (four) hours as needed for congestion.   PROMETHAZINE-DEXTROMETHORPHAN (PROMETHAZINE-DM) 6.25-15 MG/5ML SYRUP    Take 5 mLs by mouth 4 (four) times daily as needed for cough.   4:40 PM Pt here with cough and URI sxs.  He report blood tinge sputum.  cxr negative for infection.  PERC negative, doubt PE.  Will provide sxs treatment.  Return precaution discussed.     Fayrene Helper, PA-C 09/15/16 1641    Melene Plan, DO 09/15/16 [phone]

## 2016-09-15 NOTE — ED Notes (Signed)
Pt verbalized understanding of d/c instructions and has no further questions. Pt stable and NAD.  

## 2016-09-15 NOTE — ED Triage Notes (Signed)
The pt is c/o a cough and a sorethroat   Cold for 3 days  No temp

## 2016-09-21 ENCOUNTER — Ambulatory Visit: Payer: Managed Care, Other (non HMO) | Admitting: Neurology

## 2017-01-19 ENCOUNTER — Encounter (HOSPITAL_COMMUNITY): Payer: Self-pay | Admitting: Emergency Medicine

## 2017-01-19 ENCOUNTER — Ambulatory Visit (HOSPITAL_COMMUNITY)
Admission: EM | Admit: 2017-01-19 | Discharge: 2017-01-19 | Disposition: A | Discharge status: Home / Self Care | Payer: Self-pay | Attending: Family Medicine | Admitting: Family Medicine

## 2017-01-19 DIAGNOSIS — Z880 Allergy status to penicillin: Secondary | ICD-10-CM | POA: Insufficient documentation

## 2017-01-19 DIAGNOSIS — F17210 Nicotine dependence, cigarettes, uncomplicated: Secondary | ICD-10-CM | POA: Insufficient documentation

## 2017-01-19 DIAGNOSIS — Z202 Contact with and (suspected) exposure to infections with a predominantly sexual mode of transmission: Secondary | ICD-10-CM | POA: Insufficient documentation

## 2017-01-19 DIAGNOSIS — N485 Ulcer of penis: Secondary | ICD-10-CM | POA: Insufficient documentation

## 2017-01-19 MED ORDER — ACYCLOVIR 400 MG PO TABS
400.0000 mg | ORAL_TABLET | Freq: Three times a day (TID) | ORAL | 0 refills | Status: DC
Start: 2017-01-19 — End: 2022-02-08

## 2017-01-19 NOTE — ED Triage Notes (Signed)
Here for sores on genital area onset 5 days  Denies fevers, chills, penile d/c, dysuria  SA and uses condoms all the time  A&O x4... NAD

## 2017-01-19 NOTE — ED Provider Notes (Signed)
CSN: [id number]     Arrival date & time 01/19/17  1044 History   None    Chief Complaint  Patient presents with  . Exposure to STD   (Consider location/radiation/quality/duration/timing/severity/associated sxs/prior Treatment) Patient c/o having unprotected sex recently and having genital ulcer and bump and painful lesion on penis.   The history is provided by the patient.  Exposure to STD  This is a new problem. The current episode started 2 days ago. The problem occurs constantly. The problem has not changed since onset.Nothing aggravates the symptoms.    History reviewed. No pertinent past medical history. History reviewed. No pertinent surgical history. History reviewed. No pertinent family history. Social History  Substance Use Topics  . Smoking status: Current Some Day Smoker    Types: Cigarettes  . Smokeless tobacco: Never Used  . Alcohol use Yes     Comment: "Occasionally," up to 1-2 times every couple of months    Review of Systems  Constitutional: Negative.   HENT: Negative.   Eyes: Negative.   Respiratory: Negative.   Cardiovascular: Negative.   Gastrointestinal: Negative.   Endocrine: Negative.   Genitourinary: Positive for genital sores and penile pain.  Musculoskeletal: Negative.   Allergic/Immunologic: Negative.   Neurological: Negative.   Hematological: Negative.   Psychiatric/Behavioral: Negative.     Allergies  Amoxicillin; Penicillins; and Sulfa antibiotics  Home Medications   Prior to Admission medications   Medication Sig Start Date End Date Taking? Authorizing Provider  acyclovir (ZOVIRAX) 400 MG tablet Take 1 tablet (400 mg total) by mouth 3 (three) times daily. 01/19/17   Deatra Canter, FNP  diphenhydrAMINE (BENADRYL ALLERGY) 25 mg capsule Take 25 mg by mouth every 6 (six) hours as needed for itching or allergies.    Historical Provider, MD  guaiFENesin (ROBITUSSIN) 100 MG/5ML liquid Take 5-10 mLs (100-200 mg total) by mouth every 4 (four)  hours as needed for congestion. 09/15/16   Fayrene Helper, PA-C  LamoTRIgine (LAMICTAL XR) 25 MG TB24 tablet Week 1 &25: 25 mg daily, Week 3&4: 50 mg daily, Week 5: 100 mg 01/24/16   Drema Dallas, DO  promethazine-dextromethorphan (PROMETHAZINE-DM) 6.25-15 MG/5ML syrup Take 5 mLs by mouth 4 (four) times daily as needed for cough. 09/15/16   Fayrene Helper, PA-C   Meds Ordered and Administered this Visit  Medications - No data to display  BP 140/68 (BP Location: Left Arm)   Pulse 81   Temp 98.1 F (36.7 C) (Oral)   Resp 20   SpO2 100%  No data found.   Physical Exam  Constitutional: He is oriented to person, place, and time. He appears well-developed and well-nourished.  HENT:  Head: Normocephalic and atraumatic.  Eyes: Conjunctivae and EOM are normal. Pupils are equal, round, and reactive to light.  Neck: Normal range of motion. Neck supple.  Cardiovascular: Normal rate, regular rhythm and normal heart sounds.   Pulmonary/Chest: Effort normal and breath sounds normal.  Genitourinary: Penile tenderness present.  Genitourinary Comments: Painful small ulcer left penile shaft.  Neurological: He is alert and oriented to person, place, and time.  Nursing note and vitals reviewed.   Urgent Care Course     Procedures (including critical care time)  Labs Review Labs Reviewed  HSV CULTURE AND TYPING    Imaging Review No results found.   Visual Acuity Review  Right Eye Distance:   Left Eye Distance:   Bilateral Distance:    Right Eye Near:   Left Eye Near:  Bilateral Near:         MDM   1. Penile ulcer   2. Possible exposure to STD    Herpes Culture Urine cytology GC Chlamydia Trich  Acyclovir 400mg  one po tid x 7 days #21    Deatra CanterWilliam J Romonia Yanik, FNP 01/19/17 1259

## 2017-01-22 LAB — HSV CULTURE AND TYPING

## 2017-02-07 IMAGING — DX DG CHEST 2V
1 series · 1 of 1 positions shown · non-contrast
Comparison: None.

CLINICAL DATA: Chest pain and shortness of breath.  Tobacco use.

EXAM:
CHEST  2 VIEW

[chest lat]
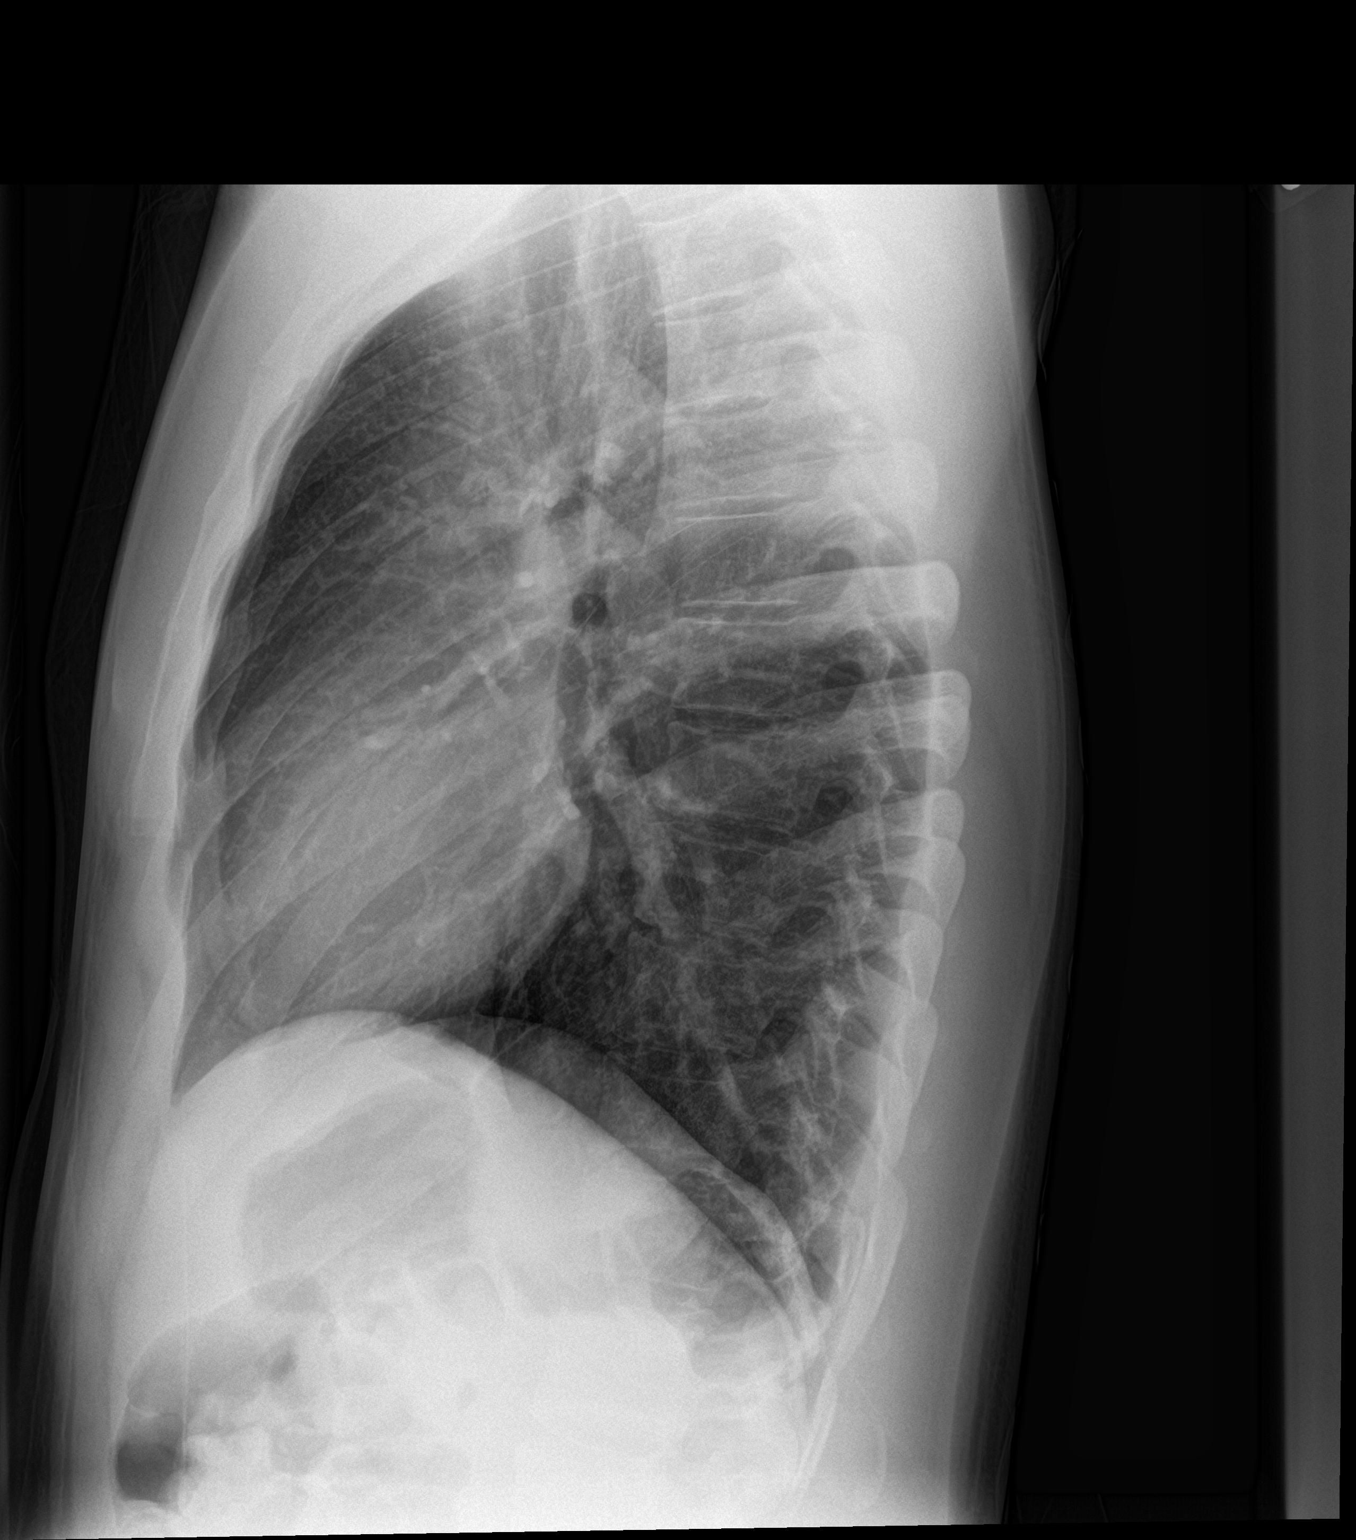

[1 of 1 positions shown; findings below may reference images not displayed]

FINDINGS: Lungs are clear. Heart size and pulmonary vascularity are normal. No
adenopathy. No pneumothorax. No bone lesions.
IMPRESSION: No abnormality noted.

## 2019-04-24 ENCOUNTER — Encounter (HOSPITAL_COMMUNITY): Payer: Self-pay

## 2019-04-24 ENCOUNTER — Other Ambulatory Visit: Payer: Self-pay

## 2019-04-24 ENCOUNTER — Ambulatory Visit (HOSPITAL_COMMUNITY)
Admission: EM | Admit: 2019-04-24 | Discharge: 2019-04-24 | Disposition: A | Discharge status: Home / Self Care | Payer: Self-pay | Attending: Internal Medicine | Admitting: Internal Medicine

## 2019-04-24 DIAGNOSIS — Z202 Contact with and (suspected) exposure to infections with a predominantly sexual mode of transmission: Secondary | ICD-10-CM | POA: Insufficient documentation

## 2019-04-24 NOTE — ED Provider Notes (Signed)
[facility]    CSN: [id number] Arrival date & time: 04/24/19  1035     History   Chief Complaint Chief Complaint  Patient presents with  . Exposure to STD    HPI Walter Sparks is a 23 y.o. male with a history of genital herpes comes to urgent care with complaints of burning sensation on micturition this morning.  Patient denies any penile discharge.  No rash on the penis.  Patient has 3 sexual partners.  Admits to unprotected sexual intercourse with 1 of the sexual partners.  His sexual partners also have other sexual partners.  No groin pain.  No testicular pain.  He has not tried any over-the-counter medications.  HPI  History reviewed. No pertinent past medical history.  Patient Active Problem List   Diagnosis Date Noted  . Localization-related idiopathic epilepsy and epileptic syndromes with seizures of localized onset, not intractable, with status epilepticus (HCC) 01/24/2016  . Possible exposure to STD 10/08/2013  . Tobacco abuse counseling 10/08/2013  . ATTENTION DEFICIT DISORDER 07/07/2008    History reviewed. No pertinent surgical history.     Home Medications    Prior to Admission medications   Medication Sig Start Date End Date Taking? Authorizing Provider  acyclovir (ZOVIRAX) 400 MG tablet Take 1 tablet (400 mg total) by mouth 3 (three) times daily. 01/19/17   Deatra Canter, FNP  diphenhydrAMINE (BENADRYL ALLERGY) 25 mg capsule Take 25 mg by mouth every 6 (six) hours as needed for itching or allergies.    [provider]  guaiFENesin (ROBITUSSIN) 100 MG/5ML liquid Take 5-10 mLs (100-200 mg total) by mouth every 4 (four) hours as needed for congestion. 09/15/16   Fayrene Helper, PA-C  promethazine-dextromethorphan (PROMETHAZINE-DM) 6.25-15 MG/5ML syrup Take 5 mLs by mouth 4 (four) times daily as needed for cough. 09/15/16   Fayrene Helper, PA-C    Family History History reviewed. No pertinent family history.  Social History Social History    Tobacco Use  . Smoking status: Current Some Day Smoker    Types: Cigarettes  . Smokeless tobacco: Never Used  Substance Use Topics  . Alcohol use: Yes    Comment: "Occasionally," up to 1-2 times every couple of months  . Drug use: Yes    Frequency: 7.0 times per week    Types: Marijuana    Comment: Daily marijuana use with tobacco     Allergies   Amoxicillin; Penicillins; and Sulfa antibiotics   Review of Systems Review of Systems  Constitutional: Negative for activity change and appetite change.  Eyes: Negative.   Respiratory: Negative.   Cardiovascular: Negative.   Gastrointestinal: Negative.   Genitourinary: Positive for dysuria and penile pain. Negative for discharge, frequency, penile swelling, scrotal swelling, testicular pain and urgency.  Musculoskeletal: Negative.   Neurological: Negative.      Physical Exam Triage Vital Signs ED Triage Vitals  Enc Vitals Group     BP 04/24/19 1052 (!) 141/88     Pulse Rate 04/24/19 1052 72     Resp --      Temp 04/24/19 1052 97.9 F (36.6 C)     Temp src --      SpO2 04/24/19 1052 100 %     Weight --      Height --      Head Circumference --      Peak Flow --      Pain Score 04/24/19 1050 3     Pain Loc --  Pain Edu? --      Excl. in GC? --    No data found.  Updated Vital Signs BP (!) 141/88   Pulse 72   Temp 97.9 F (36.6 C)   SpO2 100%   Visual Acuity Right Eye Distance:   Left Eye Distance:   Bilateral Distance:    Right Eye Near:   Left Eye Near:    Bilateral Near:     Physical Exam Constitutional:      Appearance: Normal appearance. He is not ill-appearing.  Cardiovascular:     Rate and Rhythm: Normal rate and regular rhythm.     Pulses: Normal pulses.     Heart sounds: Normal heart sounds.  Pulmonary:     Effort: Pulmonary effort is normal.     Breath sounds: Normal breath sounds.  Abdominal:     General: Abdomen is flat. There is no distension.     Palpations: Abdomen is soft.      Tenderness: There is no abdominal tenderness. There is no guarding.  Genitourinary:    Penis: Normal.      Scrotum/Testes: Normal.  Musculoskeletal: Normal range of motion.  Skin:    Capillary Refill: Capillary refill takes less than 2 seconds.  Neurological:     General: No focal deficit present.     Mental Status: He is alert and oriented to person, place, and time.      UC Treatments / Results  Labs (all labs ordered are listed, but only abnormal results are displayed) Labs Reviewed  HIV ANTIBODY (ROUTINE TESTING W REFLEX)  URINE CYTOLOGY ANCILLARY ONLY    EKG None  Radiology No results found.  Procedures Procedures (including critical care time)  Medications Ordered in UC Medications - No data to display  Initial Impression / Assessment and Plan / UC Course  I have reviewed the triage vital signs and the nursing notes.  Pertinent labs & imaging results that were available during my care of the patient were reviewed by me and considered in my medical decision making (see chart for details).     1.  Possible STD exposure: Urinalysis Urine for GC/chlamydia/trichomonas HIV testing Final Clinical Impressions(s) / UC Diagnoses   Final diagnoses:  Possible exposure to STD   Discharge Instructions   None    ED Prescriptions    None     Controlled Substance Prescriptions [place] Controlled Substance Registry consulted? Not Applicable   Merrilee JanskyLamptey, Philip O, MD 04/24/19 1126

## 2019-04-24 NOTE — ED Triage Notes (Signed)
Pt also admits to herpes outbreak and has no refills for medication

## 2019-04-24 NOTE — ED Triage Notes (Signed)
Admits to unprotected sex and started experiencing symptoms this morning

## 2019-04-25 ENCOUNTER — Telehealth (HOSPITAL_COMMUNITY): Payer: Self-pay | Admitting: Emergency Medicine

## 2019-04-25 LAB — URINE CYTOLOGY ANCILLARY ONLY
Chlamydia: POSITIVE — AB
Neisseria Gonorrhea: NEGATIVE
Trichomonas: NEGATIVE

## 2019-04-25 LAB — HIV ANTIBODY (ROUTINE TESTING W REFLEX): HIV Screen 4th Generation wRfx: NONREACTIVE

## 2019-04-25 MED ORDER — AZITHROMYCIN 250 MG PO TABS: 1000.0000 mg | ORAL_TABLET | Freq: Once | ORAL | 0 refills | Status: AC

## 2019-04-25 NOTE — Telephone Encounter (Signed)
Chlamydia is positive.  Rx po zithromax 1g #1 dose no refills was sent to the pharmacy of record.  Pt needs education to please refrain from sexual intercourse for 7 days to give the medicine time to work, sexual partners need to be notified and tested/treated.  Condoms may reduce risk of reinfection.  Recheck or followup with PCP for further evaluation if symptoms are not improving.   [facility] notified.  Patient contacted and made aware of all results, all questions answered.    

## 2019-05-13 ENCOUNTER — Ambulatory Visit (HOSPITAL_COMMUNITY)
Admission: EM | Admit: 2019-05-13 | Discharge: 2019-05-13 | Disposition: A | Discharge status: Home / Self Care | Payer: Self-pay | Attending: Family Medicine | Admitting: Family Medicine

## 2019-05-13 ENCOUNTER — Other Ambulatory Visit: Payer: Self-pay

## 2019-05-13 ENCOUNTER — Encounter (HOSPITAL_COMMUNITY): Payer: Self-pay

## 2019-05-13 DIAGNOSIS — R369 Urethral discharge, unspecified: Secondary | ICD-10-CM

## 2019-05-13 MED ORDER — CEFTRIAXONE SODIUM 250 MG IJ SOLR
INTRAMUSCULAR | Status: AC
  Filled 2019-05-13: qty 250

## 2019-05-13 MED ORDER — AZITHROMYCIN 250 MG PO TABS
ORAL_TABLET | ORAL | Status: AC
  Filled 2019-05-13: qty 4

## 2019-05-13 MED ORDER — CEFTRIAXONE SODIUM 250 MG IJ SOLR
250.0000 mg | Freq: Once | INTRAMUSCULAR | Status: AC
  Administered 2019-05-13: 13:00:00 250 mg via INTRAMUSCULAR

## 2019-05-13 MED ORDER — AZITHROMYCIN 250 MG PO TABS
1000.0000 mg | ORAL_TABLET | Freq: Once | ORAL | Status: AC
  Administered 2019-05-13: 13:00:00 1000 mg via ORAL

## 2019-05-13 MED ORDER — LIDOCAINE HCL (PF) 1 % IJ SOLN
INTRAMUSCULAR | Status: AC
  Filled 2019-05-13: qty 2

## 2019-05-13 NOTE — ED Triage Notes (Signed)
Pt presents with dysuria. Reports recently being treated for chlamydia and is curious if he needs more. Pt is now experiencing pelvic pain that comes and goes.

## 2019-05-13 NOTE — Discharge Instructions (Addendum)
We are treating you for gonorrhea and chlamydia. Lab results pending We will call with any positive results. Please refrain from sexual activity for 7 days Follow up as needed for continued or worsening symptoms

## 2019-05-14 LAB — URINE CYTOLOGY ANCILLARY ONLY
Chlamydia: POSITIVE — AB
Neisseria Gonorrhea: NEGATIVE
Trichomonas: NEGATIVE

## 2019-05-14 NOTE — ED Provider Notes (Signed)
[facility]    CSN: [id number] Arrival date & time: 05/13/19  1114     History   Chief Complaint Chief Complaint  Patient presents with  . Exposure to STD    HPI Walter Sparks is a 23 y.o. male.   Patient is a 23 year old male that presents today for exposure to STD.  He presents with dysuria.  Reports recently treated for chlamydia a couple weeks ago and has reexposed himself with sexual partner.  He has some mild pelvic pain that comes and goes.  Denies any fever, chills, body aches, night sweats.  ROS per HPI      History reviewed. No pertinent past medical history.  Patient Active Problem List   Diagnosis Date Noted  . Localization-related idiopathic epilepsy and epileptic syndromes with seizures of localized onset, not intractable, with status epilepticus (HCC) 01/24/2016  . Possible exposure to STD 10/08/2013  . Tobacco abuse counseling 10/08/2013  . ATTENTION DEFICIT DISORDER 07/07/2008    History reviewed. No pertinent surgical history.     Home Medications    Prior to Admission medications   Medication Sig Start Date End Date Taking? Authorizing Provider  acyclovir (ZOVIRAX) 400 MG tablet Take 1 tablet (400 mg total) by mouth 3 (three) times daily. 01/19/17   Deatra Canter, FNP  diphenhydrAMINE (BENADRYL ALLERGY) 25 mg capsule Take 25 mg by mouth every 6 (six) hours as needed for itching or allergies.    [provider]  guaiFENesin (ROBITUSSIN) 100 MG/5ML liquid Take 5-10 mLs (100-200 mg total) by mouth every 4 (four) hours as needed for congestion. 09/15/16   Fayrene Helper, PA-C  promethazine-dextromethorphan (PROMETHAZINE-DM) 6.25-15 MG/5ML syrup Take 5 mLs by mouth 4 (four) times daily as needed for cough. 09/15/16   Fayrene Helper, PA-C    Family History Family History  Problem Relation Age of Onset  . Healthy Mother   . Healthy Father     Social History Social History   Tobacco Use  . Smoking status: Current Some Day  Smoker    Types: Cigarettes  . Smokeless tobacco: Never Used  Substance Use Topics  . Alcohol use: Yes    Comment: "Occasionally," up to 1-2 times every couple of months  . Drug use: Yes    Frequency: 7.0 times per week    Types: Marijuana    Comment: Daily marijuana use with tobacco, reports using ecstasy      Allergies   Amoxicillin; Penicillins; and Sulfa antibiotics   Review of Systems Review of Systems  Genitourinary: Positive for discharge and dysuria. Negative for decreased urine volume, difficulty urinating, enuresis, flank pain, frequency, genital sores, hematuria, penile pain, penile swelling, scrotal swelling, testicular pain and urgency.  Musculoskeletal: Negative for arthralgias and myalgias.  Skin: Negative for rash.     Physical Exam Triage Vital Signs ED Triage Vitals  Enc Vitals Group     BP 05/13/19 1214 135/80     Pulse Rate 05/13/19 1214 68     Resp 05/13/19 1214 19     Temp 05/13/19 1214 97.9 F (36.6 C)     Temp src --      SpO2 05/13/19 1214 99 %     Weight --      Height --      Head Circumference --      Peak Flow --      Pain Score 05/13/19 1210 3     Pain Loc --      Pain Edu? --  Excl. in GC? --    No data found.  Updated Vital Signs BP 135/80   Pulse 68   Temp 97.9 F (36.6 C)   Resp 19   SpO2 99%   Visual Acuity Right Eye Distance:   Left Eye Distance:   Bilateral Distance:    Right Eye Near:   Left Eye Near:    Bilateral Near:     Physical Exam Vitals signs and nursing note reviewed.  Constitutional:      Appearance: Normal appearance.  HENT:     Head: Normocephalic and atraumatic.     Nose: Nose normal.  Eyes:     Conjunctiva/sclera: Conjunctivae normal.  Neck:     Musculoskeletal: Normal range of motion.  Pulmonary:     Effort: Pulmonary effort is normal.  Abdominal:     Palpations: Abdomen is soft.     Tenderness: There is no abdominal tenderness.  Musculoskeletal: Normal range of motion.  Skin:     General: Skin is warm and dry.  Neurological:     Mental Status: He is alert.  Psychiatric:        Mood and Affect: Mood normal.      UC Treatments / Results  Labs (all labs ordered are listed, but only abnormal results are displayed) Labs Reviewed  URINE CYTOLOGY ANCILLARY ONLY    EKG None  Radiology No results found.  Procedures Procedures (including critical care time)  Medications Ordered in UC Medications  cefTRIAXone (ROCEPHIN) injection 250 mg (250 mg Intramuscular Given 05/13/19 1314)  azithromycin (ZITHROMAX) tablet 1,000 mg (1,000 mg Oral Given 05/13/19 1315)    Initial Impression / Assessment and Plan / UC Course  I have reviewed the triage vital signs and the nursing notes.  Pertinent labs & imaging results that were available during my care of the patient were reviewed by me and considered in my medical decision making (see chart for details).     Will retreat again today for chlamydia and gonorrhea.  He was positive for chlamydia a few weeks ago but re exposed.  Instructed to restrain from sexual activity x 7 days or more.   Final Clinical Impressions(s) / UC Diagnoses   Final diagnoses:  Penile discharge     Discharge Instructions     We are treating you for gonorrhea and chlamydia. Lab results pending We will call with any positive results. Please refrain from sexual activity for 7 days Follow up as needed for continued or worsening symptoms     ED Prescriptions    None     Controlled Substance Prescriptions [place] Controlled Substance Registry consulted? Not Applicable   Janace ArisBast, Shelvy Heckert A, NP 05/14/19 1059

## 2019-05-15 ENCOUNTER — Telehealth (HOSPITAL_COMMUNITY): Payer: Self-pay | Admitting: Emergency Medicine

## 2019-05-15 NOTE — Telephone Encounter (Signed)
Chlamydia is positive.  This was treated at the urgent care visit with po zithromax 1g.  Pt needs education to please refrain from sexual intercourse for 7 days to give the medicine time to work.  Sexual partners need to be notified and tested/treated.  Condoms may reduce risk of reinfection.  Recheck or followup with PCP for further evaluation if symptoms are not improving.  [facility] notified.  Patient contacted and made aware of all results, all questions answered.   

## 2019-06-07 ENCOUNTER — Encounter (HOSPITAL_COMMUNITY): Payer: Self-pay

## 2019-06-07 ENCOUNTER — Ambulatory Visit (HOSPITAL_COMMUNITY)
Admission: EM | Admit: 2019-06-07 | Discharge: 2019-06-07 | Disposition: A | Discharge status: Home / Self Care | Payer: Self-pay | Attending: Internal Medicine | Admitting: Internal Medicine

## 2019-06-07 ENCOUNTER — Other Ambulatory Visit: Payer: Self-pay

## 2019-06-07 DIAGNOSIS — N451 Epididymitis: Secondary | ICD-10-CM | POA: Insufficient documentation

## 2019-06-07 LAB — POCT URINALYSIS DIP (DEVICE)
Bilirubin Urine: NEGATIVE
Glucose, UA: NEGATIVE mg/dL
Hgb urine dipstick: NEGATIVE
Ketones, ur: NEGATIVE mg/dL
Nitrite: NEGATIVE
Protein, ur: NEGATIVE mg/dL
Specific Gravity, Urine: 1.025 (ref 1.005–1.030)
Urobilinogen, UA: 0.2 mg/dL (ref 0.0–1.0)
pH: 7 (ref 5.0–8.0)

## 2019-06-07 MED ORDER — CEFTRIAXONE SODIUM 250 MG IJ SOLR
INTRAMUSCULAR | Status: AC
  Filled 2019-06-07: qty 250

## 2019-06-07 MED ORDER — CEFTRIAXONE SODIUM 250 MG IJ SOLR
250.0000 mg | Freq: Once | INTRAMUSCULAR | Status: AC
  Administered 2019-06-07: 13:00:00 250 mg via INTRAMUSCULAR

## 2019-06-07 MED ORDER — DOXYCYCLINE HYCLATE 100 MG PO CAPS: 100.0000 mg | ORAL_CAPSULE | Freq: Two times a day (BID) | ORAL | 0 refills | Status: DC

## 2019-06-07 MED ORDER — LIDOCAINE HCL (PF) 1 % IJ SOLN
INTRAMUSCULAR | Status: AC
Start: 2019-06-07 — End: ?
  Filled 2019-06-07: qty 2

## 2019-06-07 NOTE — ED Triage Notes (Signed)
Pt states he has pain in his testicles he has been treated for an STD and he's not sure what's going on now.

## 2019-06-07 NOTE — ED Provider Notes (Signed)
[facility]    CSN: [id number] Arrival date & time: 06/07/19  1154     History   Chief Complaint Chief Complaint  Patient presents with  . testicle pain    HPI Walter Sparks is a 23 y.o. male with no past medical history comes to urgent care complaining of dysuria and testicular pain.  Patient was recently treated for chlamydia.  He has since moved on to a new partner.  He continues to have unprotected sex.  He says that the testicular pain is persistent for most part of the day.  No relieving factors.  Aggravated by palpation.  No fever or chills.  No radiation of pain.  Occasionally he gets groin pain.  Dysuria is intermittent.  No penile discharge or rash.  No nausea vomiting.   HPI  History reviewed. No pertinent past medical history.  Patient Active Problem List   Diagnosis Date Noted  . Localization-related idiopathic epilepsy and epileptic syndromes with seizures of localized onset, not intractable, with status epilepticus (HCC) 01/24/2016  . Possible exposure to STD 10/08/2013  . Tobacco abuse counseling 10/08/2013  . ATTENTION DEFICIT DISORDER 07/07/2008    History reviewed. No pertinent surgical history.     Home Medications    Prior to Admission medications   Medication Sig Start Date End Date Taking? Authorizing Provider  acyclovir (ZOVIRAX) 400 MG tablet Take 1 tablet (400 mg total) by mouth 3 (three) times daily. 01/19/17   Deatra Canterxford, William J, FNP  diphenhydrAMINE (BENADRYL ALLERGY) 25 mg capsule Take 25 mg by mouth every 6 (six) hours as needed for itching or allergies.    [provider]  doxycycline (VIBRAMYCIN) 100 MG capsule Take 1 capsule (100 mg total) by mouth 2 (two) times daily. 06/07/19   Merrilee JanskyLamptey, Sharhonda Atwood O, MD  guaiFENesin (ROBITUSSIN) 100 MG/5ML liquid Take 5-10 mLs (100-200 mg total) by mouth every 4 (four) hours as needed for congestion. 09/15/16   Fayrene Helperran, Bowie, PA-C  promethazine-dextromethorphan (PROMETHAZINE-DM) 6.25-15 MG/5ML  syrup Take 5 mLs by mouth 4 (four) times daily as needed for cough. 09/15/16   Fayrene Helperran, Bowie, PA-C    Family History Family History  Problem Relation Age of Onset  . Healthy Mother   . Healthy Father     Social History Social History   Tobacco Use  . Smoking status: Current Some Day Smoker    Types: Cigarettes  . Smokeless tobacco: Never Used  Substance Use Topics  . Alcohol use: Yes    Comment: "Occasionally," up to 1-2 times every couple of months  . Drug use: Yes    Frequency: 7.0 times per week    Types: Marijuana    Comment: Daily marijuana use with tobacco, reports using ecstasy      Allergies   Amoxicillin, Penicillins, and Sulfa antibiotics   Review of Systems Review of Systems  Constitutional: Negative for activity change, appetite change, chills, fatigue and fever.  HENT: Negative.   Respiratory: Negative.   Gastrointestinal: Negative for abdominal pain, diarrhea, nausea, rectal pain and vomiting.  Genitourinary: Positive for difficulty urinating, discharge, dysuria, frequency, penile pain and testicular pain. Negative for enuresis, flank pain, genital sores, hematuria, penile swelling, scrotal swelling and urgency.  Musculoskeletal: Negative for arthralgias and myalgias.  Skin: Negative.   Neurological: Negative for dizziness, weakness, numbness and headaches.     Physical Exam Triage Vital Signs ED Triage Vitals  Enc Vitals Group     BP 06/07/19 1245 (!) 168/85     Pulse Rate  06/07/19 1245 90     Resp 06/07/19 1245 18     Temp 06/07/19 1245 98.8 F (37.1 C)     Temp Source 06/07/19 1245 Oral     SpO2 06/07/19 1245 98 %     Weight 06/07/19 1246 180 lb (81.6 kg)     Height --      Head Circumference --      Peak Flow --      Pain Score --      Pain Loc --      Pain Edu? --      Excl. in [place]? --    No data found.  Updated Vital Signs BP (!) 168/85 (BP Location: Right Arm)   Pulse 90   Temp 98.8 F (37.1 C) (Oral)   Resp 18   Wt 81.6 kg    SpO2 98%   BMI 25.10 kg/m   Visual Acuity Right Eye Distance:   Left Eye Distance:   Bilateral Distance:    Right Eye Near:   Left Eye Near:    Bilateral Near:     Physical Exam Eyes:     Conjunctiva/sclera: Conjunctivae normal.  Neck:     Musculoskeletal: Normal range of motion and neck supple.  Cardiovascular:     Rate and Rhythm: Normal rate and regular rhythm.     Pulses: Normal pulses.     Heart sounds: Normal heart sounds.  Pulmonary:     Effort: Pulmonary effort is normal. No respiratory distress.     Breath sounds: Normal breath sounds. No stridor. No wheezing or rales.  Abdominal:     General: Abdomen is flat.     Palpations: Abdomen is soft.  Genitourinary:    Penis: Normal.      Scrotum/Testes: Normal.     Rectum: Guaiac result negative.     Comments: Tenderness of the epididymis on palpation. Musculoskeletal: Normal range of motion.  Skin:    Capillary Refill: Capillary refill takes less than 2 seconds.  Neurological:     General: No focal deficit present.     Mental Status: He is oriented to person, place, and time.      UC Treatments / Results  Labs (all labs ordered are listed, but only abnormal results are displayed) Labs Reviewed  POCT URINALYSIS DIP (DEVICE) - Abnormal; Notable for the following components:      Result Value   Leukocytes,Ua TRACE (*)    All other components within normal limits  URINE CYTOLOGY ANCILLARY ONLY    EKG None  Radiology No results found.  Procedures Procedures (including critical care time)  Medications Ordered in UC Medications  cefTRIAXone (ROCEPHIN) injection 250 mg (250 mg Intramuscular Given 06/07/19 1318)  cefTRIAXone (ROCEPHIN) 250 MG injection (has no administration in time range)  lidocaine (PF) (XYLOCAINE) 1 % injection (has no administration in time range)    Initial Impression / Assessment and Plan / UC Course  I have reviewed the triage vital signs and the nursing notes.  Pertinent labs  & imaging results that were available during my care of the patient were reviewed by me and considered in my medical decision making (see chart for details).     1.  Acute epididymoorchitis: Ceftriaxone 250 mg IM Doxycycline 100 mg twice daily for 10 days Urine analysis is negative for urinary tract infection Urine will be sent for GC chlamydia/trichomonas Patient is educated on safe sex practices  Final Clinical Impressions(s) / UC Diagnoses   Final diagnoses:  Epididymitis,  bilateral   Discharge Instructions   None    ED Prescriptions    Medication Sig Dispense Auth. Provider   doxycycline (VIBRAMYCIN) 100 MG capsule Take 1 capsule (100 mg total) by mouth 2 (two) times daily. 20 capsule Aalyah Mansouri, Britta MccreedyPhilip O, MD     Controlled Substance Prescriptions [place] Controlled Substance Registry consulted? No   Merrilee JanskyLamptey, Axtyn Woehler O, MD 06/07/19 1328

## 2019-06-09 LAB — URINE CYTOLOGY ANCILLARY ONLY
Chlamydia: NEGATIVE
Neisseria Gonorrhea: NEGATIVE
Trichomonas: NEGATIVE

## 2019-06-28 ENCOUNTER — Ambulatory Visit (HOSPITAL_COMMUNITY)
Admission: EM | Admit: 2019-06-28 | Discharge: 2019-06-28 | Disposition: A | Discharge status: Home / Self Care | Payer: Self-pay | Attending: Internal Medicine | Admitting: Internal Medicine

## 2019-06-28 ENCOUNTER — Other Ambulatory Visit: Payer: Self-pay

## 2019-06-28 DIAGNOSIS — R350 Frequency of micturition: Secondary | ICD-10-CM | POA: Insufficient documentation

## 2019-06-28 LAB — POCT URINALYSIS DIP (DEVICE)
Bilirubin Urine: NEGATIVE
Glucose, UA: NEGATIVE mg/dL
Hgb urine dipstick: NEGATIVE
Ketones, ur: NEGATIVE mg/dL
Leukocytes,Ua: NEGATIVE
Nitrite: NEGATIVE
Protein, ur: NEGATIVE mg/dL
Specific Gravity, Urine: 1.025 (ref 1.005–1.030)
Urobilinogen, UA: 1 mg/dL (ref 0.0–1.0)
pH: 7.5 (ref 5.0–8.0)

## 2019-06-28 NOTE — ED Triage Notes (Addendum)
Per pt he was treated for std but was called and also treated and was called with leukocytes in urine .Still feeling pressure in his bladder area. Still hurting with uriantion

## 2019-06-28 NOTE — ED Provider Notes (Signed)
[facility]    CSN: [id number] Arrival date & time: 06/28/19  1208     History   Chief Complaint Chief Complaint  Patient presents with  . Urinary Tract Infection    HPI Walter Sparks is a 23 y.o. male comes to urgent care for follow-up on leukocytes in urine.  Patient complains of pressure in the bladder area and some pain with micturition.  No fever or chills.  No nausea vomiting.  No flank pain.  HPI  No past medical history on file.  Patient Active Problem List   Diagnosis Date Noted  . Localization-related idiopathic epilepsy and epileptic syndromes with seizures of localized onset, not intractable, with status epilepticus (HCC) 01/24/2016  . Possible exposure to STD 10/08/2013  . Tobacco abuse counseling 10/08/2013  . ATTENTION DEFICIT DISORDER 07/07/2008    No past surgical history on file.     Home Medications    Prior to Admission medications   Medication Sig Start Date End Date Taking? Authorizing Provider  acyclovir (ZOVIRAX) 400 MG tablet Take 1 tablet (400 mg total) by mouth 3 (three) times daily. 01/19/17   Deatra Canterxford, William J, FNP  diphenhydrAMINE (BENADRYL ALLERGY) 25 mg capsule Take 25 mg by mouth every 6 (six) hours as needed for itching or allergies.    [provider]  guaiFENesin (ROBITUSSIN) 100 MG/5ML liquid Take 5-10 mLs (100-200 mg total) by mouth every 4 (four) hours as needed for congestion. 09/15/16   Fayrene Helperran, [place], PA-C  promethazine-dextromethorphan (PROMETHAZINE-DM) 6.25-15 MG/5ML syrup Take 5 mLs by mouth 4 (four) times daily as needed for cough. 09/15/16   Fayrene Helperran, [place]ie, PA-C    Family History Family History  Problem Relation Age of Onset  . Healthy Mother   . Healthy Father     Social History Social History   Tobacco Use  . Smoking status: Current Some Day Smoker    Types: Cigarettes  . Smokeless tobacco: Never Used  Substance Use Topics  . Alcohol use: Yes    Comment: "Occasionally," up to 1-2 times every  couple of months  . 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   Respiratory: Negative.   Gastrointestinal: Negative.   Genitourinary: Positive for dysuria and penile pain. Negative for discharge, flank pain, frequency, genital sores, hematuria, penile swelling, scrotal swelling, testicular pain and urgency.  Neurological: Negative.      Physical Exam Triage Vital Signs ED Triage Vitals  Enc Vitals Group     BP 06/28/19 1339 (!) 149/83     Pulse Rate 06/28/19 1339 72     Resp 06/28/19 1339 16     Temp --      Temp src --      SpO2 06/28/19 1339 99 %     Weight --      Height --      Head Circumference --      Peak Flow --      Pain Score 06/28/19 1341 8     Pain Loc --      Pain Edu? --      Excl. in [place]? --    No data found.  Updated Vital Signs BP (!) 149/83 (BP Location: Right Arm)   Pulse 72  Resp 16   SpO2 99%   Visual Acuity Right Eye Distance:   Left Eye Distance:   Bilateral Distance:    Right Eye Near:   Left Eye Near:    Bilateral Near:     Physical Exam HENT:     Nose: No congestion or rhinorrhea.     Mouth/Throat:     Pharynx: No posterior oropharyngeal erythema.  Cardiovascular:     Rate and Rhythm: Normal rate and regular rhythm.  Pulmonary:     Effort: Pulmonary effort is normal.     Breath sounds: Normal breath sounds.  Abdominal:     General: Bowel sounds are normal.     Palpations: Abdomen is soft.  Genitourinary:    Penis: Normal.      Scrotum/Testes: Normal.  Musculoskeletal: Normal range of motion.  Skin:    General: Skin is warm.     Capillary Refill: Capillary refill takes less than 2 seconds.      UC Treatments / Results  Labs (all labs ordered are listed, but only abnormal results are  displayed) Labs Reviewed  HIV ANTIBODY (ROUTINE TESTING W REFLEX)  POCT URINALYSIS DIP (DEVICE)    EKG   Radiology No results found.  Procedures Procedures (including critical care time)  Medications Ordered in UC Medications - No data to display  Initial Impression / Assessment and Plan / UC Course  I have reviewed the triage vital signs and the nursing notes.  Pertinent labs & imaging results that were available during my care of the patient were reviewed by me and considered in my medical decision making (see chart for details).     1.  Dysuria: Point-of-care urinalysis HIV Safe sex counseling Patient is reassured that he does not have a urinary tract infection. Final Clinical Impressions(s) / UC Diagnoses   Final diagnoses:  Urinary frequency   Discharge Instructions   None    ED Prescriptions    None     Controlled Substance Prescriptions [place] Controlled Substance Registry consulted? No   Chase Picket, MD 07/01/19 1215

## 2019-06-29 LAB — HIV ANTIBODY (ROUTINE TESTING W REFLEX): HIV Screen 4th Generation wRfx: NONREACTIVE

## 2019-07-13 ENCOUNTER — Ambulatory Visit (INDEPENDENT_AMBULATORY_CARE_PROVIDER_SITE_OTHER): Payer: Self-pay

## 2019-07-13 ENCOUNTER — Ambulatory Visit (HOSPITAL_COMMUNITY)
Admission: EM | Admit: 2019-07-13 | Discharge: 2019-07-13 | Disposition: A | Discharge status: Home / Self Care | Payer: Self-pay | Attending: Family Medicine | Admitting: Family Medicine

## 2019-07-13 ENCOUNTER — Encounter (HOSPITAL_COMMUNITY): Payer: Self-pay | Admitting: Emergency Medicine

## 2019-07-13 DIAGNOSIS — N3090 Cystitis, unspecified without hematuria: Secondary | ICD-10-CM

## 2019-07-13 DIAGNOSIS — N3943 Post-void dribbling: Secondary | ICD-10-CM

## 2019-07-13 DIAGNOSIS — N342 Other urethritis: Secondary | ICD-10-CM

## 2019-07-13 DIAGNOSIS — Z882 Allergy status to sulfonamides status: Secondary | ICD-10-CM

## 2019-07-13 LAB — POCT URINALYSIS DIP (DEVICE)
Bilirubin Urine: NEGATIVE
Glucose, UA: NEGATIVE mg/dL
Hgb urine dipstick: NEGATIVE
Ketones, ur: NEGATIVE mg/dL
Leukocytes,Ua: NEGATIVE
Nitrite: NEGATIVE
Protein, ur: NEGATIVE mg/dL
Specific Gravity, Urine: 1.025 (ref 1.005–1.030)
Urobilinogen, UA: 0.2 mg/dL (ref 0.0–1.0)
pH: 7 (ref 5.0–8.0)

## 2019-07-13 MED ORDER — OXYBUTYNIN CHLORIDE ER 5 MG PO TB24: 5.0000 mg | ORAL_TABLET | Freq: Every day | ORAL | 0 refills | Status: AC

## 2019-07-13 NOTE — ED Triage Notes (Signed)
Pt states he was treated for chlamydia earlier this month but states he is still having symptoms, back pains, dysuria, pressure in his genital area.

## 2019-07-13 NOTE — ED Provider Notes (Signed)
[place]    CSN: [id number] Arrival date & time: 07/13/19  1031      History   Chief Complaint Chief Complaint  Patient presents with  . Dysuria    HPI Walter Sparks is a 23 y.o. male.   HPI  Presents today with complaint of dysuria and testicular swelling.  Patient was treated over a month ago for epididymitis also he had tested positive for chlamydia infections consistently since May with a negative test result on June 28, 2019.  He has received treatment however symptoms dysuria, urine dribbling, and urine retention have persisted.  He reports after completing the doxycycline for treatment of epididymitis.  Scrotal swelling resolved after treatment.  He has had unprotected sex since his last STD screen which was on 06/28/2019.  He also reports today some bilateral right and left flank pain that comes and goes.  He also complains of some lower pelvic discomfort with urinating.  He complains of urine stream initially starting out as a week that increases in velocity and then urine only dribbles.  He reports urine dribbling has caused incontinence and he is frequently having to wipe his penis after urinating.  He denies any visible discharge or discoloration of urine.  He is negative for fever, chills, nausea or vomiting.   History reviewed. No pertinent past medical history.  Patient Active Problem List   Diagnosis Date Noted  . Localization-related idiopathic epilepsy and epileptic syndromes with seizures of localized onset, not intractable, with status epilepticus ([place]) 01/24/2016  . Possible exposure to STD 10/08/2013  . Tobacco abuse counseling 10/08/2013  . ATTENTION DEFICIT DISORDER 07/07/2008    History reviewed. No pertinent surgical history.     Home Medications    Prior to Admission medications   Medication Sig Start Date End Date Taking? Authorizing Provider  acyclovir (ZOVIRAX) 400 MG tablet Take 1 tablet (400 mg total) by mouth 3 (three) times  daily. 01/19/17   Lysbeth Penner, FNP  diphenhydrAMINE (BENADRYL ALLERGY) 25 mg capsule Take 25 mg by mouth every 6 (six) hours as needed for itching or allergies.    [provider]  guaiFENesin (ROBITUSSIN) 100 MG/5ML liquid Take 5-10 mLs (100-200 mg total) by mouth every 4 (four) hours as needed for congestion. 09/15/16   Domenic Moras, PA-C  promethazine-dextromethorphan (PROMETHAZINE-DM) 6.25-15 MG/5ML syrup Take 5 mLs by mouth 4 (four) times daily as needed for cough. 09/15/16   Domenic Moras, PA-C    Family History Family History  Problem Relation Age of Onset  . Healthy Mother   . Healthy Father     Social History Social History   Tobacco Use  . Smoking status: Current Some Day Smoker    Types: Cigarettes  . Smokeless tobacco: Never Used  Substance Use Topics  . Alcohol use: Yes    Comment: "Occasionally," up to 1-2 times every couple of months  . 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 --      Excl. in [place]? --    No data found.  Updated Vital Signs BP (!) 155/82   Pulse 88   Temp 98.6 F (37 C)   Resp 14   SpO2 98%   Visual Acuity Right Eye Distance:   Left Eye Distance:   Bilateral Distance:    Right Eye Near:   Left Eye Near:    Bilateral Near:     Physical Exam General appearance: alert, well developed, well nourished, cooperative and in no distress  Head: Normocephalic, without obvious abnormality, atraumatic Respiratory: Respirations even and unlabored, normal respiratory rate Heart: rate and rhythm normal. No gallop or murmurs noted on exam  Abdomen: BS +, no distention, no rebound tenderness, or no mass Extremities: No gross deformities Skin: Skin color, texture, turgor normal. No rashes seen  Psych: Appropriate mood and affect. Neurologic: Mental status: Alert, oriented to person, place, and time, thought content appropriate.  UC Treatments / Results  Labs (all labs ordered are listed, but only abnormal results are displayed) Labs Reviewed  URINE CYTOLOGY ANCILLARY ONLY    EKG   Radiology Dg Abd 1 View  Result Date: 07/13/2019 CLINICAL DATA:  Abdominal pain.  Nausea. EXAM: ABDOMEN - 1 VIEW COMPARISON:  None. FINDINGS: The bowel gas pattern is normal. No radio-opaque calculi or other significant radiographic abnormality are seen. IMPRESSION: Benign-appearing abdomen and pelvis. Electronically Signed   By: Francene BoyersJames  Maxwell M.D.   On: 07/13/2019 12:31    Procedures Procedures (including critical care time)  Medications Ordered in UC Medications - No data to display  Initial Impression / Assessment and Plan / UC Course  I have reviewed the triage vital signs and the nursing notes.  Pertinent labs & imaging results that were available during my care of the patient were reviewed by me and considered in my medical decision making (see chart for details).   UA is normal today. Given symptoms suspect will recheck for STD infection.  Patient is having some symptoms worrisome for an underlying urological disorder therefore I am providing him with contact information to follow-up with [facility].  In the meantime given his symptoms of urinary retention and urine dribbling will trial a course of Ditropan in order to try and regulate his bladder activity.  Initially would have tried Flomax patient has a sulfa allergy therefore  unable to proceed with prescription.  Patient advised that he will be notified of abnormal STD results only and if positive he will need to return to office for treatment.  Patient verbalized understanding and agreement with plan.    Final Clinical Impressions(s) / UC Diagnoses   Final diagnoses:  Cystitis  Urethritis  Dribbling of urine     Discharge Instructions     X-ray imaging was negative of kidney stones and showed no abnormality of the kidneys.  Your urinalysis today was also normal.  STD testing is pending.  You have a sulfa allergy therefore I am unable to prescribe Flomax will trial you on Ditropan 5 mg once daily at bedtime to help promote good urine flow and reduce symptoms  of dribbling.  Please contact [facility] tomorrow to schedule a appointment for further evaluation of your current urine symptoms and symptoms of urine dribbling with urine retention.    Your STD screen is pending.  Recommend practicing safe sex with barrier protection such as condom consistently. You will be notified of your STD test results if abnormal.    ED Prescriptions    Medication Sig Dispense Auth. Provider   oxybutynin (DITROPAN XL) 5 MG 24 hr tablet Take 1 tablet (5 mg total) by mouth at bedtime for 20 days. 20 tablet Bing NeighborsHarris, Rennee Coyne S, FNP     Controlled Substance Prescriptions [place] Controlled Substance Registry consulted? Not Applicable   Bing NeighborsHarris, Skie Vitrano S, FNP 07/13/19 [phone]

## 2019-07-13 NOTE — Discharge Instructions (Addendum)
X-ray imaging was negative of kidney stones and showed no abnormality of the kidneys.  Your urinalysis today was also normal.  STD testing is pending.  You have a sulfa allergy therefore I am unable to prescribe Flomax will trial you on Ditropan 5 mg once daily at bedtime to help promote good urine flow and reduce symptoms of dribbling.  Please contact [facility] tomorrow to schedule a appointment for further evaluation of your current urine symptoms and symptoms of urine dribbling with urine retention.    Your STD screen is pending.  Recommend practicing safe sex with barrier protection such as condom consistently. You will be notified of your STD test results if abnormal.

## 2019-07-14 LAB — URINE CULTURE: Culture: NO GROWTH

## 2019-07-15 LAB — URINE CYTOLOGY ANCILLARY ONLY
Chlamydia: NEGATIVE
Neisseria Gonorrhea: NEGATIVE
Trichomonas: NEGATIVE

## 2019-12-10 ENCOUNTER — Emergency Department (HOSPITAL_COMMUNITY)
Admission: EM | Admit: 2019-12-10 | Discharge: 2019-12-10 | Disposition: A | Discharge status: Home / Self Care | Payer: Self-pay | Attending: Emergency Medicine | Admitting: Emergency Medicine

## 2019-12-10 ENCOUNTER — Other Ambulatory Visit: Payer: Self-pay

## 2019-12-10 ENCOUNTER — Encounter (HOSPITAL_COMMUNITY): Payer: Self-pay | Admitting: Emergency Medicine

## 2019-12-10 DIAGNOSIS — F1290 Cannabis use, unspecified, uncomplicated: Secondary | ICD-10-CM | POA: Insufficient documentation

## 2019-12-10 DIAGNOSIS — Z79899 Other long term (current) drug therapy: Secondary | ICD-10-CM | POA: Insufficient documentation

## 2019-12-10 DIAGNOSIS — U071 COVID-19: Secondary | ICD-10-CM | POA: Insufficient documentation

## 2019-12-10 DIAGNOSIS — R0981 Nasal congestion: Secondary | ICD-10-CM | POA: Insufficient documentation

## 2019-12-10 DIAGNOSIS — Z720 Tobacco use: Secondary | ICD-10-CM | POA: Insufficient documentation

## 2019-12-10 HISTORY — DX: Unspecified convulsions: R56.9

## 2019-12-10 NOTE — ED Provider Notes (Signed)
[facility] EMERGENCY DEPARTMENT Provider Note   CSN: [id number] Arrival date & time: 12/10/19  2025     History Chief Complaint  Patient presents with  . Facial Pain    Walter Sparks is a 23 y.o. male presents to emergency department today with chief complaint of sinus congestion x3 days.  Patient states he thinks his symptoms started after he left a window open and also had a fan on causing there to be cold air in the apartment.  He has been taking Mucinex with symptom relief as well as drinking water and garlic.  He reports associated loss of sense of smell.  He denies any sick contacts.  Denies any contact with anyone positive for COVID-19. Denies any fever, chills, sore throat, cough, headache, shortness of breath, chest pain, abdominal pain, nausea, vomiting, urinary symptoms, diarrhea. History provided by patient with additional history obtained from chart review.       Past Medical History:  Diagnosis Date  . Seizures [facility])     Patient Active Problem List   Diagnosis Date Noted  . Localization-related idiopathic epilepsy and epileptic syndromes with seizures of localized onset, not intractable, with status epilepticus ([place]) 01/24/2016  . Possible exposure to STD 10/08/2013  . Tobacco abuse counseling 10/08/2013  . ATTENTION DEFICIT DISORDER 07/07/2008    History reviewed. No pertinent surgical history.     Family History  Problem Relation Age of Onset  . Healthy Mother   . Healthy Father     Social History   Tobacco Use  . Smoking status: Current Some Day Smoker    Types: Cigarettes  . Smokeless tobacco: Never Used  Substance Use Topics  . Alcohol use: Yes    Comment: "Occasionally," up to 1-2 times every couple of months  . 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 Authorizing Provider  acyclovir (ZOVIRAX) 400 MG tablet Take 1 tablet (400 mg total) by mouth 3 (three) times daily. 01/19/17   Lysbeth Penner, FNP  diphenhydrAMINE (BENADRYL ALLERGY) 25 mg capsule Take 25 mg by mouth every 6 (six) hours as needed for itching or allergies.    [provider]  guaiFENesin (ROBITUSSIN) 100 MG/5ML liquid Take 5-10 mLs (100-200 mg total) by mouth every 4 (four) hours as needed for congestion. 09/15/16   Domenic Moras, PA-C  promethazine-dextromethorphan (PROMETHAZINE-DM) 6.25-15 MG/5ML syrup Take 5 mLs by mouth 4 (four) times daily as needed for cough. 09/15/16   Domenic Moras, PA-C    Allergies    Amoxicillin, Penicillins, and Sulfa antibiotics  Review of Systems   Review of Systems  All other systems are reviewed and are negative for acute change except as noted in the HPI.   Physical Exam Updated Vital Signs BP (!) 138/91   Pulse 61   Temp 98.2 F (36.8 C) (Oral)   Resp 16   SpO2 100%   Physical Exam Vitals and nursing note reviewed.  Constitutional:      Appearance: He is well-developed. He is not ill-appearing or toxic-appearing.  HENT:     Head: Normocephalic and atraumatic.     Comments: No sinus or temporal tenderness.     Right Ear: Tympanic membrane and external ear normal. Tympanic membrane is not erythematous or bulging.  Left Ear: Tympanic membrane and external ear normal. Tympanic membrane is not erythematous or bulging.     Nose: Nose normal.     Mouth/Throat:     Comments: No erythema to oropharynx, no edema, no exudate, no tonsillar swelling, voice normal, neck supple without lymphadenopathy  Eyes:     General: No scleral icterus.       Right eye: No discharge.        Left eye: No discharge.     Conjunctiva/sclera: Conjunctivae normal.  Neck:     Vascular: No JVD.     Comments: No meningeal signs Cardiovascular:     Rate and Rhythm: Normal rate and regular rhythm.     Pulses: Normal pulses.      Heart sounds: Normal heart sounds.  Pulmonary:     Effort: Pulmonary effort is normal.     Breath sounds: Normal breath sounds.  Abdominal:     General: There is no distension.  Musculoskeletal:        General: Normal range of motion.     Cervical back: Normal range of motion.  Skin:    General: Skin is warm and dry.  Neurological:     Mental Status: He is oriented to person, place, and time.     GCS: GCS eye subscore is 4. GCS verbal subscore is 5. GCS motor subscore is 6.     Comments: Fluent speech, no facial droop.  Psychiatric:        Behavior: Behavior normal.     ED Results / Procedures / Treatments   Labs (all labs ordered are listed, but only abnormal results are displayed) Labs Reviewed  NOVEL CORONAVIRUS, NAA (HOSP ORDER, SEND-OUT TO REF LAB; TAT 18-24 HRS)    EKG None  Radiology No results found.  Procedures Procedures (including critical care time)  Medications Ordered in ED Medications - No data to display  ED Course  I have reviewed the triage vital signs and the nursing notes.  Pertinent labs & imaging results that were available during my care of the patient were reviewed by me and considered in my medical decision making (see chart for details).    MDM Rules/Calculators/A&P                      Patient seen and examined. Patient nontoxic appearing, in no apparent distress, vitals WNL.  He is presenting with 3 days of sinus congestion.  His exam is overall benign, no significant findings.  He has normal work of breathing.H he does not have sinus tenderness. No clinical signs of severe illness, dehydration, to warrant further emergent work up in ER.  DDx includes viral sinus infection, URI, Covid, less likely pneumonia. Doubt bacterial bronchitis or pneumonia.  No signs or symptoms to suggest strep pharyngitis. Send out Covid test performed.  Patient aware he will need to self quarantine until he has a test result. Given reassuring physical exam,  symptoms, will discharge with symptomatic treatment. Recommend telemedicine PCP f/u in the next 2-3 days for persistent symptoms  for further guidance. Self-isolation instructions discussed. Pt was given home self-isolation instructions and instructions for family members.   Pt understands signs and symptoms that would warrant return to ED.  Pt comfortable and agreeable with POC.    Burnard HawthorneJason T Mendizabal was evaluated in Emergency Department on 12/10/2019 for the symptoms described in the history of present illness. He was evaluated in the context of the global COVID-19 pandemic, which necessitated consideration that  the patient might be at risk for infection with the SARS-CoV-2 virus that causes COVID-19. Institutional protocols and algorithms that pertain to the evaluation of patients at risk for COVID-19 are in a state of rapid change based on information released by regulatory bodies including the CDC and federal and state organizations. These policies and algorithms were followed during the patient's care in the ED.  Portions of this note were generated with Scientist, clinical (histocompatibility and immunogenetics). Dictation errors may occur despite best attempts at proofreading. Final Clinical Impression(s) / ED Diagnoses Final diagnoses:  Sinus congestion    Rx / DC Orders ED Discharge Orders    None       Kathyrn Lass 12/10/19 1007    Charlynne Pander, MD 12/10/19 1520

## 2019-12-10 NOTE — ED Triage Notes (Signed)
states having sinus congestion x 3 days   And diff smelling

## 2019-12-10 NOTE — Discharge Instructions (Signed)
You were seen in the ED for **.  I suspect you have a virus. We tested your for COVID-19 (coronavirus) infection.  It is also possible you could have other viral upper respiratory infection from another virus.    Test results come back in 48 hours, sometimes sooner.  Someone will call you if ypu are positive for COVID. If the result is negative you can see it on your MyChart.  Treatment of your illness and symptoms will include self-isolation, monitoring of symptoms and supportive care with over-the-counter medicines.    Return to the ED if there is increased work of breathing, shortness of breath, inability to tolerate fluids, weakness, chest pain.  If your test results are POSITIVE, the following isolation requirements need to be met to return to work and resume essential activities: At least 10 days since symptom onset  72 hours of absence of fever without antifever medicine (ibuprofen, acetaminophen). A fever is temperature of 100.6F or greater. Improvement of respiratory symptoms  If your test is NEGATIVE, you may return to work and essential activities as long as your symptoms have improved and you do not have a fever for a total of 3 days.  Call your job and notify them that your test result was negative to see if they will allow you to return to work.   Stay well-hydrated. Rest. You can use over the counter medications to help with symptoms: 600 mg ibuprofen (motrin, aleve, advil) or acetaminophen (tylenol) every 6 hours, around the clock to help with associated fevers, sore throat, headaches, generalized body aches and malaise.  Oxymetazoline (afrin) intranasal spray once daily for no more than 3 days to help with congestion, after 3 days you can switch to another over-the-counter nasal steroid spray such as fluticasone (flonase) Allergy medication (loratadine, cetirizine, etc) and phenylephrine (sudafed) help with nasal congestion, runny nose and postnasal drip.   Dextromethorphan  (Delsym) to suppress dry cough. Frequent coughing is likely causing your chest wall pain Wash your hands often to prevent spread.

## 2019-12-11 LAB — NOVEL CORONAVIRUS, NAA (HOSP ORDER, SEND-OUT TO REF LAB; TAT 18-24 HRS): SARS-CoV-2, NAA: DETECTED — AB

## 2020-09-21 ENCOUNTER — Emergency Department (HOSPITAL_COMMUNITY)
Admission: EM | Admit: 2020-09-21 | Discharge: 2020-09-21 | Disposition: A | Discharge status: Home / Self Care | Payer: Managed Care, Other (non HMO) | Attending: Emergency Medicine | Admitting: Emergency Medicine

## 2020-09-21 ENCOUNTER — Encounter (HOSPITAL_COMMUNITY): Payer: Self-pay | Admitting: Emergency Medicine

## 2020-09-21 ENCOUNTER — Other Ambulatory Visit: Payer: Self-pay

## 2020-09-21 DIAGNOSIS — R110 Nausea: Secondary | ICD-10-CM | POA: Insufficient documentation

## 2020-09-21 DIAGNOSIS — R197 Diarrhea, unspecified: Secondary | ICD-10-CM | POA: Insufficient documentation

## 2020-09-21 DIAGNOSIS — Z5321 Procedure and treatment not carried out due to patient leaving prior to being seen by health care provider: Secondary | ICD-10-CM | POA: Insufficient documentation

## 2020-09-21 NOTE — ED Triage Notes (Signed)
Patient arrives to ED with complaints of nausea that started today at work/. Per pt he got the cold sweats at work today, got really nauseous, and have x1 episode of diarrhea that was "black colored." Pt states he ate some "bad vietnamese food" yesterday.

## 2021-07-20 ENCOUNTER — Ambulatory Visit (HOSPITAL_COMMUNITY)
Admission: EM | Admit: 2021-07-20 | Discharge: 2021-07-20 | Disposition: A | Discharge status: Home / Self Care | Payer: Self-pay | Attending: Internal Medicine | Admitting: Internal Medicine

## 2021-07-20 ENCOUNTER — Other Ambulatory Visit: Payer: Self-pay

## 2021-07-20 ENCOUNTER — Encounter (HOSPITAL_COMMUNITY): Payer: Self-pay

## 2021-07-20 DIAGNOSIS — J069 Acute upper respiratory infection, unspecified: Secondary | ICD-10-CM

## 2021-07-20 DIAGNOSIS — J029 Acute pharyngitis, unspecified: Secondary | ICD-10-CM

## 2021-07-20 DIAGNOSIS — R059 Cough, unspecified: Secondary | ICD-10-CM

## 2021-07-20 DIAGNOSIS — Z20822 Contact with and (suspected) exposure to covid-19: Secondary | ICD-10-CM | POA: Insufficient documentation

## 2021-07-20 DIAGNOSIS — F17210 Nicotine dependence, cigarettes, uncomplicated: Secondary | ICD-10-CM | POA: Insufficient documentation

## 2021-07-20 DIAGNOSIS — Z880 Allergy status to penicillin: Secondary | ICD-10-CM | POA: Insufficient documentation

## 2021-07-20 DIAGNOSIS — Z882 Allergy status to sulfonamides status: Secondary | ICD-10-CM | POA: Insufficient documentation

## 2021-07-20 LAB — POCT RAPID STREP A, ED / UC: Streptococcus, Group A Screen (Direct): NEGATIVE

## 2021-07-20 MED ORDER — PROMETHAZINE-DM 6.25-15 MG/5ML PO SYRP: 5.0000 mL | ORAL_SOLUTION | Freq: Four times a day (QID) | ORAL | 0 refills | Status: DC | PRN

## 2021-07-20 NOTE — ED Provider Notes (Signed)
[facility]    CSN: [id number] Arrival date & time: 07/20/21  3151      History   Chief Complaint Chief Complaint  Patient presents with   Cough   Emesis    HPI Walter Sparks CURRENT is a 25 y.o. male.   Patient presents with 2-day history of cough and nasal congestion.  Has had episodes of emesis due to excessive coughing.  Denies any current nausea or diarrhea.  Denies any known sick contacts or fever.  Has taken several over-the-counter cough medications without relief of symptoms.  Denies any chest pain or shortness of breath.   Cough Emesis  Past Medical History:  Diagnosis Date   Seizures [facility])     Patient Active Problem List   Diagnosis Date Noted   Localization-related idiopathic epilepsy and epileptic syndromes with seizures of localized onset, not intractable, with status epilepticus ([facility]) 01/24/2016   Possible exposure to STD 10/08/2013   Tobacco abuse counseling 10/08/2013   ATTENTION DEFICIT DISORDER 07/07/2008    History reviewed. No pertinent surgical history.     Home Medications    Prior to Admission medications   Medication Sig Start Date End Date Taking? Authorizing Provider  promethazine-dextromethorphan (PROMETHAZINE-DM) 6.25-15 MG/5ML syrup Take 5 mLs by mouth 4 (four) times daily as needed for cough. 07/20/21  Yes Lance Muss, FNP  acyclovir (ZOVIRAX) 400 MG tablet Take 1 tablet (400 mg total) by mouth 3 (three) times daily. 01/19/17   Deatra Canter, FNP  diphenhydrAMINE (BENADRYL) 25 mg capsule Take 25 mg by mouth every 6 (six) hours as needed for itching or allergies.    [provider]  guaiFENesin (ROBITUSSIN) 100 MG/5ML liquid Take 5-10 mLs (100-200 mg total) by mouth every 4 (four) hours as needed for congestion. 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 --      Excl. in GC? --    No data found.  Updated Vital Signs BP (!) 143/90 (BP Location: Left Arm)   Pulse 65  Temp 99.2 F (37.3 C) (Oral)   Resp 18   SpO2 96%   Visual Acuity Right Eye Distance:   Left Eye Distance:   Bilateral Distance:    Right Eye Near:   Left Eye Near:    Bilateral Near:     Physical Exam Constitutional:      General: He is not in acute distress.    Appearance: Normal appearance.  HENT:     Head: Normocephalic and atraumatic.     Right Ear: Tympanic membrane and ear canal normal.     Left Ear: Tympanic membrane and ear canal normal.     Nose: Congestion present.     Mouth/Throat:     Mouth: Mucous membranes are moist.     Pharynx: Posterior oropharyngeal erythema present.  Eyes:     Extraocular Movements: Extraocular movements intact.     Conjunctiva/sclera: Conjunctivae normal.     Pupils: Pupils are equal, round, and reactive to light.  Cardiovascular:     Rate and Rhythm: Normal rate and regular rhythm.     Pulses: Normal pulses.     Heart sounds: Normal heart sounds.   Pulmonary:     Effort: Pulmonary effort is normal. No respiratory distress.     Breath sounds: Normal breath sounds. No wheezing.     Comments: Harsh cough on exam Abdominal:     General: Abdomen is flat. Bowel sounds are normal.     Palpations: Abdomen is soft.  Musculoskeletal:        General: Normal range of motion.     Cervical back: Normal range of motion.  Skin:    General: Skin is warm and dry.  Neurological:     General: No focal deficit present.     Mental Status: He is alert and oriented to person, place, and time. Mental status is at baseline.  Psychiatric:        Mood and Affect: Mood normal.        Behavior: Behavior normal.     UC Treatments / Results  Labs (all labs ordered are listed, but only abnormal results are displayed) Labs Reviewed  CULTURE, GROUP A STREP ([facility])  SARS CORONAVIRUS 2 (TAT 6-24 HRS)  POCT RAPID STREP A, ED / UC    EKG   Radiology No results found.  Procedures Procedures (including critical care time)  Medications Ordered in UC Medications - No data to display  Initial Impression / Assessment and Plan / UC Course  I have reviewed the triage vital signs and the nursing notes.  Pertinent labs & imaging results that were available during my care of the patient were reviewed by me and considered in my medical decision making (see chart for details).     Patient presents with symptoms likely from a viral upper respiratory infection. Differential includes bacterial pneumonia, sinusitis, allergic rhinitis, Covid 19. Do not suspect underlying cardiopulmonary process. Symptoms seem unlikely related to ACS, CHF or COPD exacerbations, pneumonia, pneumothorax. Patient is nontoxic appearing and not in need of emergent medical intervention.  Recommended symptom control with over the counter medications: Daily oral anti-histamine, Oral decongestant or IN corticosteroid, saline irrigations, cepacol lozenges, Robitussin, Delsym, honey  tea.  Rapid strep test was negative in urgent care today.  Throat culture and COVID-19 viral swab are pending.  Cough medication sent in for patient's harsh cough on physical exam.  Return if symptoms fail to improve in 1-2 weeks or you develop shortness of breath, chest pain, severe headache. Patient states  understanding and is agreeable.  Discharged with PCP  or urgent care followup.  Final Clinical Impressions(s) / UC Diagnoses   Final diagnoses:  Viral upper respiratory tract infection  Cough     Discharge Instructions      Rapid strep test was negative in urgent care.  Throat culture and COVID-19 viral swab are pending.  We will call if these are positive.  You have been prescribed a medication to take as needed for cough.  Please be advised that this may cause drowsiness.     ED Prescriptions     Medication Sig Dispense Auth. Provider   promethazine-dextromethorphan (PROMETHAZINE-DM) 6.25-15 MG/5ML syrup Take 5 mLs by mouth 4 (four) times daily as needed for cough. 118 mL Lance Muss, FNP      PDMP not reviewed this encounter.   Lance Muss, FNP 07/20/21 2037

## 2021-07-20 NOTE — Discharge Instructions (Addendum)
Rapid strep test was negative in urgent care.  Throat culture and COVID-19 viral swab are pending.  We will call if these are positive.  You have been prescribed a medication to take as needed for cough.  Please be advised that this may cause drowsiness.

## 2021-07-20 NOTE — ED Triage Notes (Signed)
Pt reports cough and emesis x 2 days.

## 2021-07-21 LAB — SARS CORONAVIRUS 2 (TAT 6-24 HRS): SARS Coronavirus 2: NEGATIVE

## 2021-07-23 LAB — CULTURE, GROUP A STREP (THRC)

## 2022-02-08 ENCOUNTER — Ambulatory Visit (HOSPITAL_COMMUNITY)
Admission: EM | Admit: 2022-02-08 | Discharge: 2022-02-08 | Disposition: A | Discharge status: Home / Self Care | Payer: Self-pay | Attending: Urgent Care | Admitting: Urgent Care

## 2022-02-08 ENCOUNTER — Other Ambulatory Visit: Payer: Self-pay

## 2022-02-08 ENCOUNTER — Ambulatory Visit (INDEPENDENT_AMBULATORY_CARE_PROVIDER_SITE_OTHER): Payer: Self-pay

## 2022-02-08 ENCOUNTER — Encounter (HOSPITAL_COMMUNITY): Payer: Self-pay

## 2022-02-08 DIAGNOSIS — R053 Chronic cough: Secondary | ICD-10-CM

## 2022-02-08 DIAGNOSIS — J4531 Mild persistent asthma with (acute) exacerbation: Secondary | ICD-10-CM

## 2022-02-08 MED ORDER — ALBUTEROL SULFATE (2.5 MG/3ML) 0.083% IN NEBU
INHALATION_SOLUTION | RESPIRATORY_TRACT | Status: AC
  Filled 2022-02-08: qty 3

## 2022-02-08 MED ORDER — GUAIFENESIN ER 600 MG PO TB12: 1200.0000 mg | ORAL_TABLET | Freq: Two times a day (BID) | ORAL | 0 refills | Status: DC | PRN

## 2022-02-08 MED ORDER — MONTELUKAST SODIUM 10 MG PO TABS: 10.0000 mg | ORAL_TABLET | Freq: Every day | ORAL | 0 refills | Status: DC

## 2022-02-08 MED ORDER — ALBUTEROL SULFATE HFA 108 (90 BASE) MCG/ACT IN AERS: 1.0000 | INHALATION_SPRAY | Freq: Four times a day (QID) | RESPIRATORY_TRACT | 0 refills | Status: DC | PRN

## 2022-02-08 MED ORDER — LORATADINE 10 MG PO TABS: 10.0000 mg | ORAL_TABLET | Freq: Every day | ORAL | 0 refills | Status: DC

## 2022-02-08 MED ORDER — ALBUTEROL SULFATE (2.5 MG/3ML) 0.083% IN NEBU
2.5000 mg | INHALATION_SOLUTION | Freq: Once | RESPIRATORY_TRACT | Status: AC
  Administered 2022-02-08: 2.5 mg via RESPIRATORY_TRACT

## 2022-02-08 NOTE — Discharge Instructions (Addendum)
Please start taking the loratadine 10mg  every morning. Please take the montelukast at night. This medication might make you drowsy. Take mucinex twice daily to help break up the phlegm, stop once symptoms resolve. Use the inhaler 2 puffs up to 4 times daily as needed. Please follow up with pulmonology to determine the exact cause of your symptoms. If any chest pain, worsening shortness of breath, or fever develops head to ER.

## 2022-02-08 NOTE — ED Provider Notes (Incomplete)
[facility]    CSN: [id number] Arrival date & time: 02/08/22  0931      History   Chief Complaint Chief Complaint  Patient presents with   Cough    HPI Walter Sparks is a 26 y.o. male.   Pleasant 26 year old male presents today with concerns of a cough.  He states has been going on for the past 6 months.  He was last seen in August 2022 for this, had a strep flu and COVID test which were negative, and sent home with a prescription for promethazine cough syrup.  Patient states he has been having a cough pretty persistently ever since.  He does have a history of smoking, but states he has not smoked anything for the past 3 weeks.  He reports that overall his cough is usually dry, with intermittent production.  He denies any hemoptysis.  He denies any fever.  He states currently he feels slightly short of breath, but not always.  He took a home COVID test yesterday which was negative.  He has been taking over-the-counter Robitussin and intermittent Benadryl without any resolution to his symptoms.  Over the past 24 hours he is also had significant nasal congestion, runny nose, and redness to the cheeks of his face.   Cough Associated symptoms: rhinorrhea    Past Medical History:  Diagnosis Date   Seizures [facility])     Patient Active Problem List   Diagnosis Date Noted   Localization-related idiopathic epilepsy and epileptic syndromes with seizures of localized onset, not intractable, with status epilepticus ([facility]) 01/24/2016   Possible exposure to STD 10/08/2013   Tobacco abuse counseling 10/08/2013   ATTENTION DEFICIT DISORDER 07/07/2008    History reviewed. No pertinent surgical history.     Home Medications    Prior to Admission medications   Medication Sig Start Date End Date Taking? Authorizing Provider  diphenhydrAMINE (BENADRYL) 25 mg capsule Take 25 mg by mouth every 6 (six) hours as needed for itching or allergies.    [provider]   guaiFENesin (ROBITUSSIN) 100 MG/5ML liquid Take 5-10 mLs (100-200 mg total) by mouth every 4 (four) hours as needed for congestion. 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   Respiratory:  Positive for cough and chest tightness.   All other systems reviewed and are negative.   Physical Exam Triage Vital Signs ED Triage Vitals  Enc Vitals Group     BP 02/08/22 1026 138/88     Pulse Rate 02/08/22 1026 82     Resp 02/08/22 1026 17     Temp 02/08/22 1026 99.4 F (37.4 C)     Temp Source 02/08/22 1026 Oral  SpO2 02/08/22 1026 94 %     Weight --      Height --      Head Circumference --      Peak Flow --      Pain Score 02/08/22 1025 3     Pain Loc --      Pain Edu? --      Excl. in GC? --    No data found.  Updated Vital Signs BP 138/88 (BP Location: Right Arm)    Pulse 82    Temp 99.4 F (37.4 C) (Oral)    Resp 17    SpO2 94%   Visual Acuity Right Eye Distance:   Left Eye Distance:   Bilateral Distance:    Right Eye Near:   Left Eye Near:    Bilateral Near:     Physical Exam Vitals and nursing note reviewed. Exam conducted with a chaperone present.  Constitutional:      General: He is not in acute distress.    Appearance: Normal appearance. He is normal weight. He is not ill-appearing  or toxic-appearing.  HENT:     Head: Normocephalic and atraumatic.     Right Ear: Tympanic membrane, ear canal and external ear normal. There is no impacted cerumen.     Left Ear: Tympanic membrane, ear canal and external ear normal. There is no impacted cerumen.     Nose: Congestion and rhinorrhea present.     Mouth/Throat:     Mouth: Mucous membranes are moist.     Pharynx: Oropharynx is clear. No oropharyngeal exudate or posterior oropharyngeal erythema.     Comments: Lymphoid hyperplasia posterior pharynx Eyes:     General: No scleral icterus.       Right eye: No discharge.        Left eye: No discharge.     Extraocular Movements: Extraocular movements intact.     Conjunctiva/sclera: Conjunctivae normal.     Pupils: Pupils are equal, round, and reactive to light.  Cardiovascular:     Rate and Rhythm: Normal rate and regular rhythm.     Pulses: Normal pulses.     Heart sounds: Normal heart sounds. No murmur heard.   No friction rub. No gallop.  Pulmonary:     Effort: Pulmonary effort is normal. No respiratory distress.     Breath sounds: No stridor. Wheezing present. No rhonchi or rales.     Comments: Slightly decreased breath sounds to bilateral apices Chest:     Chest wall: No tenderness.  Musculoskeletal:     Cervical back: Normal range of motion and neck supple. No rigidity or tenderness.  Lymphadenopathy:     Cervical: No cervical adenopathy.  Neurological:     Mental Status: He is alert.     UC Treatments / Results  Labs (all labs ordered are listed, but only abnormal results are displayed) Labs Reviewed - No data to display  EKG   Radiology DG Chest 2 View  Result Date: 02/08/2022 CLINICAL DATA:  26 year old male with history of chronic cough. EXAM: CHEST - 2 VIEW COMPARISON:  09/15/2016 FINDINGS: The mediastinal contours are within normal limits. No cardiomegaly. The lungs are clear bilaterally without evidence of focal consolidation, pleural effusion, or  pneumothorax. No acute osseous abnormality. IMPRESSION: No acute cardiopulmonary process. Electronically Signed   By: Marliss Coots M.D.   On: 02/08/2022 10:59    Procedures Procedures (including critical care time)  Medications Ordered in UC Medications  albuterol (PROVENTIL) (2.5 MG/3ML) 0.083% nebulizer  solution 2.5 mg (2.5 mg Nebulization Given 02/08/22 1138)    Initial Impression / Assessment and Plan / UC Course  I have reviewed the triage vital signs and the nursing notes.  Pertinent labs & imaging results that were available during my care of the patient were reviewed by me and considered in my medical decision making (see chart for details).     *** Final Clinical Impressions(s) / UC Diagnoses   Final diagnoses:  None   Discharge Instructions   None    ED Prescriptions   None    PDMP not reviewed this encounter.

## 2022-02-08 NOTE — ED Triage Notes (Signed)
Pt presents with c/o coughs x 3 months. States he was taking Promethazine. States he has not been able to breathe in his sleep and states he has been drinking a lot of fluids. States he has not had any relief with the medicine prescribed to him.

## 2022-02-08 NOTE — ED Provider Notes (Signed)
[facility]    CSN: [id number] Arrival date & time: 02/08/22  0931      History   Chief Complaint Chief Complaint  Patient presents with   Cough    HPI Walter Sparks is a 26 y.o. male.   Pleasant 26 year old male presents today with concerns of a cough.  He states has been going on for the past 6 months.  He was last seen in August 2022 for this, had a strep flu and COVID test which were negative, and sent home with a prescription for promethazine cough syrup.  Patient states he has been having a cough pretty persistently ever since.  He does have a history of smoking, but states he has not smoked anything for the past 3 weeks.  He reports that overall his cough is usually dry, with intermittent production.  He denies any hemoptysis.  He denies any fever.  He states currently he feels slightly short of breath, but not always. He has intermittent chest tightness, but no chest pain or palpitations. He took a home COVID test yesterday which was negative.  He has been taking over-the-counter Robitussin and intermittent Benadryl without any resolution to his symptoms.  Over the past 24 hours he is also had significant nasal congestion, runny nose, and redness to the cheeks of his face.     Cough Associated symptoms: rhinorrhea    Past Medical History:  Diagnosis Date   Seizures [facility])     Patient Active Problem List   Diagnosis Date Noted   Localization-related idiopathic epilepsy and epileptic syndromes with seizures of localized onset, not intractable, with status epilepticus (HCC) 01/24/2016   Possible exposure to STD 10/08/2013   Tobacco abuse counseling 10/08/2013   ATTENTION DEFICIT DISORDER 07/07/2008    History reviewed. No pertinent surgical history.     Home Medications    Prior to Admission medications   Medication Sig Start Date End Date Taking? Authorizing Provider  albuterol (VENTOLIN HFA) 108 (90 Base) MCG/ACT inhaler Inhale 1-2 puffs into the  lungs every 6 (six) hours as needed for wheezing or shortness of breath. 02/08/22  Yes Christipher Rieger L, PA  guaiFENesin (MUCINEX) 600 MG 12 hr tablet Take 2 tablets (1,200 mg total) by mouth 2 (two) times daily as needed for cough or to loosen phlegm. 02/08/22  Yes Mollee Neer L, PA  loratadine (CLARITIN) 10 MG tablet Take 1 tablet (10 mg total) by mouth daily. 02/08/22  Yes Corrin Hingle L, PA  montelukast (SINGULAIR) 10 MG tablet Take 1 tablet (10 mg total) by mouth at bedtime. 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   Respiratory:  Positive for cough and  chest tightness.     Physical Exam Triage Vital Signs ED Triage Vitals  Enc Vitals Group     BP 02/08/22 1026 138/88     Pulse Rate 02/08/22 1026 82     Resp 02/08/22 1026 17     Temp 02/08/22 1026 99.4 F (37.4 C)     Temp Source 02/08/22 1026 Oral     SpO2 02/08/22 1026 94 %     Weight --      Height --      Head Circumference --      Peak Flow --      Pain Score 02/08/22 1025 3     Pain Loc --      Pain Edu? --      Excl. in GC? --    No data found.  Updated Vital Signs BP 138/88 (BP Location: Right Arm)    Pulse 82    Temp 99.4 F (37.4 C) (Oral)    Resp 17    SpO2 94%   Visual Acuity Right Eye Distance:   Left Eye Distance:   Bilateral  Distance:    Right Eye Near:   Left Eye Near:    Bilateral Near:     Physical Exam Vitals and nursing note reviewed. Exam conducted with a chaperone present.  Constitutional:      General: He is not in acute distress.    Appearance: Normal appearance. He is normal weight. He is not ill-appearing or toxic-appearing.  HENT:     Head: Normocephalic and atraumatic.     Right Ear: Tympanic membrane, ear canal and external ear normal. There is no impacted cerumen.     Left Ear: Tympanic membrane, ear canal and external ear normal. There is no impacted cerumen.     Nose: Congestion and rhinorrhea present.     Mouth/Throat:     Mouth: Mucous membranes are moist.     Pharynx: Oropharynx is clear. No oropharyngeal exudate or posterior oropharyngeal erythema.     Comments: Lymphoid hyperplasia posterior pharynx Eyes:     General: No scleral icterus.       Right eye: No discharge.        Left eye: No discharge.     Extraocular Movements: Extraocular movements intact.     Conjunctiva/sclera: Conjunctivae normal.     Pupils: Pupils are equal, round, and reactive to light.  Cardiovascular:     Rate and Rhythm: Normal rate and regular rhythm.     Pulses: Normal pulses.     Heart sounds: Normal heart sounds. No murmur heard.   No friction rub. No gallop.  Pulmonary:     Effort: Pulmonary effort is normal. No respiratory distress.     Breath sounds: No stridor. Wheezing present. No rhonchi or rales.     Comments: Slightly decreased breath sounds to bilateral apices Chest:     Chest wall: No tenderness.  Musculoskeletal:     Cervical back: Normal range of motion and neck supple. No rigidity or tenderness.  Lymphadenopathy:     Cervical: No cervical adenopathy.  Neurological:     Mental Status: He is alert.  UC Treatments / Results  Labs (all labs ordered are listed, but only abnormal results are displayed) Labs Reviewed - No data to display  EKG   Radiology DG Chest 2  View  Result Date: 02/08/2022 CLINICAL DATA:  26 year old male with history of chronic cough. EXAM: CHEST - 2 VIEW COMPARISON:  09/15/2016 FINDINGS: The mediastinal contours are  within normal limits. No cardiomegaly. The lungs are clear bilaterally without evidence of focal consolidation, pleural effusion, or pneumothorax. No acute osseous abnormality. IMPRESSION: No acute cardiopulmonary process. Electronically Signed   By: Marliss Coots M.D.   On: 02/08/2022 10:59    Procedures Procedures (including critical care time)  Medications Ordered in UC Medications  albuterol (PROVENTIL) (2.5 MG/3ML) 0.083% nebulizer solution 2.5 mg (2.5 mg Nebulization Given 02/08/22 1138)    Initial Impression / Assessment and Plan / UC Course  I have reviewed the triage vital signs and the nursing notes.  Pertinent labs & imaging results that were available during my care of the patient were reviewed by me and considered in my medical decision making (see chart for details).     Chronic cough - pt has had this since last seen in August 2022. CXR in office negative. His O2 was 94% upon presentation, but was 97% upon DC from office after being given a nebulizer.  Reactive airway disease - given his cough and URI sx, will tx with antihistamine, singulair, and inhaler. Pt tolerated neb in office with resolution to his symptoms. Recommended pt f/u with pulmonology should he continue to have persistent symptoms despite treatment offered today. Nasal congestion - as above. Add mucinex   Final Clinical Impressions(s) / UC Diagnoses   Final diagnoses:  Chronic cough  Mild persistent reactive airway disease with acute exacerbation     Discharge Instructions      Please start taking the loratadine 10mg  every morning. Please take the montelukast at night. This medication might make you drowsy. Take mucinex twice daily to help break up the phlegm, stop once symptoms resolve. Use the inhaler 2 puffs up to 4 times  daily as needed. Please follow up with pulmonology to determine the exact cause of your symptoms. If any chest pain, worsening shortness of breath, or fever develops head to ER.    ED Prescriptions     Medication Sig Dispense Auth. Provider   guaiFENesin (MUCINEX) 600 MG 12 hr tablet Take 2 tablets (1,200 mg total) by mouth 2 (two) times daily as needed for cough or to loosen phlegm. 20 tablet Genora Arp L, PA   montelukast (SINGULAIR) 10 MG tablet Take 1 tablet (10 mg total) by mouth at bedtime. 30 tablet Eleyna Brugh L, PA   loratadine (CLARITIN) 10 MG tablet Take 1 tablet (10 mg total) by mouth daily. 30 tablet Mariany Mackintosh L, PA   albuterol (VENTOLIN HFA) 108 (90 Base) MCG/ACT inhaler Inhale 1-2 puffs into the lungs every 6 (six) hours as needed for wheezing or shortness of breath. 18 g Nalda Shackleford L,      PDMP not reviewed this encounter.   [place], Maretta Bees 02/08/22 1320

## 2022-03-06 ENCOUNTER — Encounter (HOSPITAL_COMMUNITY): Payer: Self-pay

## 2022-03-06 ENCOUNTER — Other Ambulatory Visit: Payer: Self-pay

## 2022-03-06 ENCOUNTER — Ambulatory Visit (HOSPITAL_COMMUNITY)
Admission: EM | Admit: 2022-03-06 | Discharge: 2022-03-06 | Disposition: A | Discharge status: Home / Self Care | Payer: Self-pay | Attending: Family Medicine | Admitting: Family Medicine

## 2022-03-06 DIAGNOSIS — Z8619 Personal history of other infectious and parasitic diseases: Secondary | ICD-10-CM

## 2022-03-06 DIAGNOSIS — R369 Urethral discharge, unspecified: Secondary | ICD-10-CM

## 2022-03-06 DIAGNOSIS — Z113 Encounter for screening for infections with a predominantly sexual mode of transmission: Secondary | ICD-10-CM

## 2022-03-06 LAB — HIV ANTIBODY (ROUTINE TESTING W REFLEX): HIV Screen 4th Generation wRfx: NONREACTIVE

## 2022-03-06 MED ORDER — CEFTRIAXONE SODIUM 500 MG IJ SOLR
INTRAMUSCULAR | Status: AC
  Filled 2022-03-06: qty 500

## 2022-03-06 MED ORDER — VALACYCLOVIR HCL 1 G PO TABS: 1000.0000 mg | ORAL_TABLET | Freq: Two times a day (BID) | ORAL | 0 refills | Status: AC

## 2022-03-06 MED ORDER — CEFTRIAXONE SODIUM 500 MG IJ SOLR
500.0000 mg | Freq: Once | INTRAMUSCULAR | Status: AC
  Administered 2022-03-06: 500 mg via INTRAMUSCULAR

## 2022-03-06 MED ORDER — LIDOCAINE HCL (PF) 1 % IJ SOLN
INTRAMUSCULAR | Status: AC
  Filled 2022-03-06: qty 2

## 2022-03-06 NOTE — Discharge Instructions (Addendum)
-   We will let you know with any positive results  ?- No sexual activity until you and all of your partners are tested and treated appropriately ?- Please use condoms with every sexual encounter ?- I have sent refill of Valtrex to your pharmacy to start at the onset of your next flare ?

## 2022-03-06 NOTE — ED Triage Notes (Signed)
Pt presents with penile discharge for the last several days. He would like to be tested for STI. ?

## 2022-03-06 NOTE — ED Provider Notes (Signed)
?[facility] ? ? ? ?CSN: [id number] ?Arrival date & time: 03/06/22  1334 ? ? ?  ? ?History   ?Chief Complaint ?Chief Complaint  ?Patient presents with  ? Exposure to STD  ? ? ?HPI ?Walter Sparks is a 26 y.o. male.  ? ?Patient presents for STI testing.  He reports recent sexual activity without condom use.  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  He reports penile discharge, however denies fevers, nausea, vomiting, swollen lymph nodes in groin.  Denies any current rashes, lesions, open sores on penis or genitalia.  Denies burning with urination, increased urinary frequency.  He is requesting STI testing including HIV and syphilis. ? ? ?Past Medical History:  ?Diagnosis Date  ? Seizures (HCC)   ? ? ?Patient Active Problem List  ? Diagnosis Date Noted  ? Localization-related idiopathic epilepsy and epileptic syndromes with seizures of localized onset, not intractable, with status epilepticus (HCC) 01/24/2016  ? Possible exposure to STD 10/08/2013  ? Tobacco abuse counseling 10/08/2013  ? ATTENTION DEFICIT DISORDER 07/07/2008  ? ? ?History reviewed. No pertinent surgical history. ? ? ? ? ?Home Medications   ? ?Prior to Admission medications   ?Medication Sig Start Date End Date Taking? Authorizing Provider  ?valACYclovir (VALTREX) 1000 MG tablet Take 1 tablet (1,000 mg total) by mouth 2 (two) times daily for 10 days. Take at onset of flare 03/06/22 03/16/22 Yes Valentino Nose, NP  ?albuterol (VENTOLIN HFA) 108 (90 Base) MCG/ACT inhaler Inhale 1-2 puffs into the lungs every 6 (six) hours as needed for wheezing or shortness of breath. 02/08/22   Crain, Whitney L, PA  ?guaiFENesin (MUCINEX) 600 MG 12 hr tablet Take 2 tablets (1,200 mg total) by mouth 2 (two) times daily as needed for cough or to loosen phlegm. 02/08/22   Crain, Alphonzo Lemmings L, PA  ?loratadine (CLARITIN) 10 MG tablet Take 1 tablet (10 mg total) by mouth daily. 02/08/22   Crain,  Whitney L, PA  ?montelukast (SINGULAIR) 10 MG tablet Take 1 tablet (10 mg total) by mouth at bedtime. 02/08/22   Maretta Bees, PA  ? ? ?Family History ?Family History  ?Problem Relation Age of Onset  ? Healthy Mother   ? Healthy Father   ? ? ?Social History ?Social History  ? ?Tobacco Use  ? Smoking status: Some Days  ?  Types: Cigarettes  ? Smokeless tobacco: Never  ?Vaping Use  ? Vaping Use: Never used  ?Substance Use Topics  ? Alcohol use: Yes  ?  Comment: "Occasionally," up to 1-2 times every couple of months  ? Drug use: Yes  ?  Frequency: 7.0 times per week  ?  Types: Marijuana  ?  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 ? ? ?Allergies   ?Penicillins, Sulfa antibiotics, and Amoxicillin ? ? ?Review of Systems ?Review of Systems ?Per HPI ? ?Physical Exam ?Triage Vital Signs ?ED Triage Vitals [03/06/22 1540]  ?Enc Vitals Group  ?   BP (!) 143/100  ?   Pulse Rate 78  ?   Resp 16  ?   Temp 98.7 ?F (37.1 ?C)  ?   Temp Source Oral  ?   SpO2 100 %  ?   Weight   ?   Height   ?   Head Circumference   ?   Peak Flow   ?   Pain Score   ?  Pain Loc   ?   Pain Edu?   ?   Excl. in GC?   ? ?No data found. ? ?Updated Vital Signs ?BP (!) 143/100 (BP Location: Left Arm)   Pulse 78   Temp 98.7 ?F (37.1 ?C) (Oral)   Resp 16   SpO2 100%  ? ?Visual Acuity ?Right Eye Distance:   ?Left Eye Distance:   ?Bilateral Distance:   ? ?Right Eye Near:   ?Left Eye Near:    ?Bilateral Near:    ? ?Physical Exam ?Vitals and nursing note reviewed.  ?Constitutional:   ?   General: He is not in acute distress. ?   Appearance: Normal appearance. He is not toxic-appearing.  ?Pulmonary:  ?   Effort: Pulmonary effort is normal. No respiratory distress.  ?Genitourinary: ?   Comments: Deferred ?Skin: ?   General: Skin is warm and dry.  ?   Capillary Refill: Capillary refill takes less than 2 seconds.  ?   Coloration: Skin is not jaundiced or pale.  ?Neurological:  ?   Mental Status: He is alert and oriented to person,  place, and time.  ?   Motor: No weakness.  ?   Gait: Gait normal.  ?Psychiatric:     ?   Behavior: Behavior is cooperative.  ? ? ? ?UC Treatments / Results  ?Labs ?(all labs ordered are listed, but only abnormal results are displayed) ?Labs Reviewed  ?HIV ANTIBODY (ROUTINE TESTING W REFLEX)  ?RPR  ?CYTOLOGY, (ORAL, ANAL, URETHRAL) ANCILLARY ONLY  ? ? ?EKG ? ? ?Radiology ?No results found. ? ?Procedures ?Procedures (including critical care time) ? ?Medications Ordered in UC ?Medications  ?cefTRIAXone (ROCEPHIN) injection 500 mg (has no administration in time range)  ? ? ?Initial Impression / Assessment and Plan / UC Course  ?I have reviewed the triage vital signs and the nursing notes. ? ?Pertinent labs & imaging results that were available during my care of the patient were reviewed by me and considered in my medical decision making (see chart for details). ? ?  ?We will test urethral self swab for gonorrhea, chlamydia, trichomonas.  We will also screen for HIV and syphilis.  Given penile discharge, treat empirically with ceftriaxone 500 mg IM today.  We will send in valacyclovir refill to have on hand in case of outbreak.  Patient does have a history of hive reaction to penicillin, however has tolerated ceftriaxone well in the past.  Encouraged refraining from sexual activity until results are back.  Encouraged condom use with every sexual encounter. ?Final Clinical Impressions(s) / UC Diagnoses  ? 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 ? ? ?Discharge Instructions   ? ?  ?- We will let you know with any positive results  ?- No sexual activity until you and all of your partners are tested and treated appropriately ?- Please use condoms with every sexual encounter ?- I have sent refill of Valtrex to your pharmacy to start at the onset of your next flare ? ? ? ? ?ED Prescriptions   ? ?  Medication Sig Dispense Auth. Provider  ? valACYclovir (VALTREX) 1000 MG tablet Take 1 tablet (1,000 mg total) by mouth 2 (two) times daily for 10 days. Take at onset of flare 20 tablet Valentino Nose, NP  ? ?  ? ?PDMP not reviewed this encounter. ?  ?Valentino Nose, NP ?  03/06/22 1638 ? ?

## 2022-03-07 LAB — RPR: RPR Ser Ql: NONREACTIVE

## 2022-03-07 LAB — CYTOLOGY, (ORAL, ANAL, URETHRAL) ANCILLARY ONLY
Chlamydia: POSITIVE — AB
Comment: NEGATIVE
Comment: NEGATIVE
Comment: NORMAL
Neisseria Gonorrhea: NEGATIVE
Trichomonas: NEGATIVE

## 2022-03-08 ENCOUNTER — Telehealth (HOSPITAL_COMMUNITY): Payer: Self-pay | Admitting: Emergency Medicine

## 2022-03-08 MED ORDER — DOXYCYCLINE HYCLATE 100 MG PO CAPS: 100.0000 mg | ORAL_CAPSULE | Freq: Two times a day (BID) | ORAL | 0 refills | Status: AC

## 2022-04-13 ENCOUNTER — Encounter (HOSPITAL_COMMUNITY): Payer: Self-pay

## 2022-04-13 ENCOUNTER — Ambulatory Visit (HOSPITAL_COMMUNITY)
Admission: RE | Admit: 2022-04-13 | Discharge: 2022-04-13 | Disposition: A | Discharge status: Home / Self Care | Payer: Self-pay | Source: Ambulatory Visit | Attending: Physician Assistant | Admitting: Physician Assistant

## 2022-04-13 VITALS — BP 147/80 | HR 73 | Temp 98.4°F | Resp 17

## 2022-04-13 DIAGNOSIS — Z711 Person with feared health complaint in whom no diagnosis is made: Secondary | ICD-10-CM

## 2022-04-13 DIAGNOSIS — Z202 Contact with and (suspected) exposure to infections with a predominantly sexual mode of transmission: Secondary | ICD-10-CM

## 2022-04-13 DIAGNOSIS — R350 Frequency of micturition: Secondary | ICD-10-CM

## 2022-04-13 LAB — POCT URINALYSIS DIPSTICK, ED / UC
Bilirubin Urine: NEGATIVE
Glucose, UA: NEGATIVE mg/dL
Hgb urine dipstick: NEGATIVE
Ketones, ur: NEGATIVE mg/dL
Leukocytes,Ua: NEGATIVE
Nitrite: NEGATIVE
Protein, ur: NEGATIVE mg/dL
Specific Gravity, Urine: 1.02 (ref 1.005–1.030)
Urobilinogen, UA: 0.2 mg/dL (ref 0.0–1.0)
pH: 7 (ref 5.0–8.0)

## 2022-04-13 MED ORDER — DOXYCYCLINE HYCLATE 100 MG PO CAPS: 100.0000 mg | ORAL_CAPSULE | Freq: Two times a day (BID) | ORAL | 0 refills | Status: DC

## 2022-04-13 NOTE — ED Triage Notes (Signed)
One wk h/o lower abdominal pressure and urinary frequency. Pt was notified yesterday of exposure to chlamydia. ?Denies penile discharge. ?

## 2022-04-13 NOTE — ED Provider Notes (Addendum)
?[facility] ? ? ? ?CSN: [id number] ?Arrival date & time: 04/13/22  1120 ? ? ?  ? ?History   ?Chief Complaint ?Chief Complaint  ?Patient presents with  ? 11:30appt  ? Exposure to STD  ? ? ?HPI ?Walter Sparks is a 26 y.o. male.  ? ?Patient presents today with a 1 week history of lower abdominal pain and urinary frequency.  Denies any dysuria, penile discharge, fever, nausea, vomiting.  He was seen approximately a month ago empirically treated for gonorrhea and STI swab results showed chlamydia which he reports completing treatment and having resolution of symptoms.  He reports that one of his partners has recently identified that they tested positive for chlamydia so he is concerned about reexposure causing symptoms.  He denies any additional antibiotic use.  Denies history of nephrolithiasis.   ? ? ?Past Medical History:  ?Diagnosis Date  ? Seizures (HCC)   ? ? ?Patient Active Problem List  ? Diagnosis Date Noted  ? Localization-related idiopathic epilepsy and epileptic syndromes with seizures of localized onset, not intractable, with status epilepticus (HCC) 01/24/2016  ? Possible exposure to STD 10/08/2013  ? Tobacco abuse counseling 10/08/2013  ? ATTENTION DEFICIT DISORDER 07/07/2008  ? ? ?History reviewed. No pertinent surgical history. ? ? ? ? ?Home Medications   ? ?Prior to Admission medications   ?Medication Sig Start Date End Date Taking? Authorizing Provider  ?doxycycline (VIBRAMYCIN) 100 MG capsule Take 1 capsule (100 mg total) by mouth 2 (two) times daily. 04/13/22  Yes Soriyah Osberg, Denny Peon K, PA-C  ?albuterol (VENTOLIN HFA) 108 (90 Base) MCG/ACT inhaler Inhale 1-2 puffs into the lungs every 6 (six) hours as needed for wheezing or shortness of breath. 02/08/22   Guy Sandifer L, PA  ? ? ?Family History ?Family History  ?Problem Relation Age of Onset  ? Healthy Mother   ? Healthy Father   ? ? ?Social History ?Social History  ? ?Tobacco Use  ? Smoking status: Some Days  ?  Types: Cigarettes  ? Smokeless  tobacco: Never  ?Vaping Use  ? Vaping Use: Never used  ?Substance Use Topics  ? Alcohol use: Yes  ?  Comment: "Occasionally," up to 1-2 times every couple of months  ? Drug use: Yes  ?  Frequency: 7.0 times per week  ?  Types: Marijuana  ?  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 ? ? ?Allergies   ?Penicillins, Sulfa antibiotics, and Amoxicillin ? ? ?Review of Systems ?Review of Systems  ?Constitutional:  Positive for activity change. Negative for appetite change, fatigue and fever.  ?Respiratory:  Negative for cough and shortness of breath.   ?Cardiovascular:  Negative for chest pain.  ?Gastrointestinal:  Positive for abdominal pain. Negative for diarrhea, nausea and vomiting.  ?Genitourinary:  Positive for frequency. Negative for dysuria, genital sores, hematuria, penile discharge, penile pain and urgency.  ? ? ?Physical Exam ?Triage Vital Signs ?ED Triage Vitals [04/13/22 1142]  ?Enc Vitals Group  ?   BP (!) 147/80  ?   Pulse Rate 73  ?   Resp 17  ?   Temp 98.4 ?F (36.9 ?C)  ?   Temp Source Oral  ?   SpO2 98 %  ?   Weight   ?   Height   ?   Head Circumference   ?   Peak Flow   ?   Pain Score 2  ?   Pain Loc   ?  Pain Edu?   ?   Excl. in GC?   ? ?No data found. ? ?Updated Vital Signs ?BP (!) 147/80 (BP Location: Left Arm)   Pulse 73   Temp 98.4 ?F (36.9 ?C) (Oral)   Resp 17   SpO2 98%  ? ?Visual Acuity ?Right Eye Distance:   ?Left Eye Distance:   ?Bilateral Distance:   ? ?Right Eye Near:   ?Left Eye Near:    ?Bilateral Near:    ? ?Physical Exam ?Vitals reviewed.  ?Constitutional:   ?   General: He is awake.  ?   Appearance: Normal appearance. He is well-developed. He is not ill-appearing.  ?   Comments: Very pleasant male appears stated age in no acute distress sitting comfortably in exam room  ?HENT:  ?   Head: Normocephalic and atraumatic.  ?   Mouth/Throat:  ?   Pharynx: No oropharyngeal exudate, posterior oropharyngeal erythema or uvula swelling.  ?Cardiovascular:  ?   Rate and  Rhythm: Normal rate and regular rhythm.  ?   Heart sounds: Normal heart sounds, S1 normal and S2 normal. No murmur heard. ?Pulmonary:  ?   Effort: Pulmonary effort is normal.  ?   Breath sounds: Normal breath sounds. No stridor. No wheezing, rhonchi or rales.  ?   Comments: Clear to auscultation bilaterally ?Abdominal:  ?   General: Bowel sounds are normal.  ?   Palpations: Abdomen is soft.  ?   Tenderness: There is no abdominal tenderness. There is no right CVA tenderness, left CVA tenderness, guarding or rebound.  ?   Comments: Benign abdominal exam  ?Genitourinary: ?   Comments: Exam deferred ?Neurological:  ?   Mental Status: He is alert.  ?Psychiatric:     ?   Behavior: Behavior is cooperative.  ? ? ? ?UC Treatments / Results  ?Labs ?(all labs ordered are listed, but only abnormal results are displayed) ?Labs Reviewed  ?POCT URINALYSIS DIPSTICK, ED / UC  ?CYTOLOGY, (ORAL, ANAL, URETHRAL) ANCILLARY ONLY  ? ? ?EKG ? ? ?Radiology ?No results found. ? ?Procedures ?Procedures (including critical care time) ? ?Medications Ordered in UC ?Medications - No data to display ? ?Initial Impression / Assessment and Plan / UC Course  ?I have reviewed the triage vital signs and the nursing notes. ? ?Pertinent labs & imaging results that were available during my care of the patient were reviewed by me and considered in my medical decision making (see chart for details). ? ?  ? ?UA normal in clinic today.  STI swab results pending.  Discussed that he should abstain from sex until results are available.  Discussed the importance of safe sex practices.  Patient was started on doxycycline to cover for chlamydia given likely exposure.  Offered HIV/hepatitis/syphilis testing which he declined.  If he develops any worsening symptoms he is return for reevaluation to which she expressed understanding. ? ?Final Clinical Impressions(s) / UC Diagnoses  ? ?Final diagnoses:  ?Exposure to STD  ?Concern about STD in male without diagnosis   ?Urinary frequency  ? ? ? ?Discharge Instructions   ? ?  ?We will contact you with your swab results within a few days if we need to arrange any treatment.  Please abstain from sex until you receive these results.  Make sure you are drinking plenty of fluid and limit alcohol as this could contribute to your urinary frequency.  Use a condom with each sexual encounter.  If you develop any abdominal pain, fever, nausea, vomiting  you should be seen immediately. ? ? ? ? ?ED Prescriptions   ? ? Medication Sig Dispense Auth. Provider  ? doxycycline (VIBRAMYCIN) 100 MG capsule Take 1 capsule (100 mg total) by mouth 2 (two) times daily. 14 capsule Seona Clemenson K, PA-C  ? ?  ? ?PDMP not reviewed this encounter. ?  ?Jeani Hawking, PA-C ?04/13/22 1221 ? ?  ?Jeani Hawking, PA-C ?04/13/22 1227 ? ?

## 2022-04-13 NOTE — Discharge Instructions (Addendum)
We will contact you with your swab results within a few days if we need to arrange any treatment.  Please abstain from sex until you receive these results.  Make sure you are drinking plenty of fluid and limit alcohol as this could contribute to your urinary frequency.  Use a condom with each sexual encounter.  If you develop any abdominal pain, fever, nausea, vomiting you should be seen immediately. ?

## 2022-04-14 LAB — CYTOLOGY, (ORAL, ANAL, URETHRAL) ANCILLARY ONLY
Chlamydia: NEGATIVE
Comment: NEGATIVE
Comment: NEGATIVE
Comment: NORMAL
Neisseria Gonorrhea: NEGATIVE
Trichomonas: POSITIVE — AB

## 2022-04-17 ENCOUNTER — Telehealth (HOSPITAL_COMMUNITY): Payer: Self-pay

## 2022-04-17 MED ORDER — METRONIDAZOLE 500 MG PO TABS: 2000.0000 mg | ORAL_TABLET | Freq: Once | ORAL | 0 refills | Status: AC

## 2022-04-17 NOTE — Progress Notes (Signed)
Attempted to reach patient x 1, left VM. Medication sent to pharmacy on file.

## 2022-05-19 ENCOUNTER — Ambulatory Visit (HOSPITAL_COMMUNITY): Payer: Self-pay

## 2022-05-19 ENCOUNTER — Encounter (HOSPITAL_COMMUNITY): Payer: Self-pay | Admitting: Emergency Medicine

## 2022-05-19 ENCOUNTER — Ambulatory Visit (HOSPITAL_COMMUNITY)
Admission: EM | Admit: 2022-05-19 | Discharge: 2022-05-19 | Disposition: A | Discharge status: Home / Self Care | Payer: Self-pay | Attending: Student | Admitting: Student

## 2022-05-19 DIAGNOSIS — Z113 Encounter for screening for infections with a predominantly sexual mode of transmission: Secondary | ICD-10-CM | POA: Insufficient documentation

## 2022-05-19 DIAGNOSIS — Z202 Contact with and (suspected) exposure to infections with a predominantly sexual mode of transmission: Secondary | ICD-10-CM

## 2022-05-19 LAB — POCT URINALYSIS DIPSTICK, ED / UC
Bilirubin Urine: NEGATIVE
Glucose, UA: NEGATIVE mg/dL
Hgb urine dipstick: NEGATIVE
Ketones, ur: NEGATIVE mg/dL
Leukocytes,Ua: NEGATIVE
Nitrite: NEGATIVE
Protein, ur: NEGATIVE mg/dL
Specific Gravity, Urine: 1.025 (ref 1.005–1.030)
Urobilinogen, UA: 0.2 mg/dL (ref 0.0–1.0)
pH: 7 (ref 5.0–8.0)

## 2022-05-19 LAB — HIV ANTIBODY (ROUTINE TESTING W REFLEX): HIV Screen 4th Generation wRfx: NONREACTIVE

## 2022-05-19 NOTE — Discharge Instructions (Addendum)
-  We have sent testing for sexually transmitted infections. We will notify you of any positive results once they are received. If required, we will prescribe any medications you might need. Please refrain from all sexual activity until treatment is complete.  -Seek additional medical attention if you develop fevers/chills, new/worsening abdominal pain, new/worsening penile discomfort/discharge, etc.   

## 2022-05-19 NOTE — ED Provider Notes (Signed)
[facility]    CSN: [id number] Arrival date & time: 05/19/22  1539      History   Chief Complaint Chief Complaint  Patient presents with   SEXUALLY TRANSMITTED DISEASE    Entered by patient   Abdominal Pain   Dysuria    HPI Walter Sparks is a 26 y.o. male presenting with dysuria, frequency, lower abdominal pain.  History chlamydia, trichomoniasis.  He describes dysuria, frequency, lower abdominal pain.  Also with abdominal bloating and indigestion, states this developed after he changed his diet; bowel movements are regular and he is still passing gas.  There is no penile discharge, penile or testicular pain, penile or testicular rash or lesion.  The back pain is over the bilateral lower back, worse with movement.  There is no midline spinous tenderness.  States symptoms developed after unprotected intercourse with ex-girlfriend; he now has a new girlfriend and they want to get tested before becoming intimate.  HPI  Past Medical History:  Diagnosis Date   Seizures [facility])     Patient Active Problem List   Diagnosis Date Noted   Localization-related idiopathic epilepsy and epileptic syndromes with seizures of localized onset, not intractable, with status epilepticus (HCC) 01/24/2016   Possible exposure to STD 10/08/2013   Tobacco abuse counseling 10/08/2013   ATTENTION DEFICIT DISORDER 07/07/2008    History reviewed. No pertinent surgical history.     Home Medications    Prior to Admission medications   Medication Sig Start Date End Date Taking? Authorizing Provider  albuterol (VENTOLIN HFA) 108 (90 Base) MCG/ACT inhaler Inhale 1-2 puffs into the lungs every 6 (six) hours as needed for wheezing or shortness of breath. 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  Respiratory:  Negative for shortness of breath.   Cardiovascular:  Negative for chest pain.  Gastrointestinal:  Negative for abdominal pain, blood in stool, constipation, diarrhea, nausea and vomiting.  Genitourinary:  Positive for dysuria. Negative for decreased urine volume, difficulty urinating, flank pain, frequency, genital sores, hematuria and urgency.  Musculoskeletal:  Negative for back pain.  Neurological:  Negative for dizziness, weakness and light-headedness.  All other systems reviewed and are negative.   Physical Exam Triage Vital Signs ED Triage Vitals  Enc Vitals Group     BP 05/19/22 1555 (!) 145/89     Pulse Rate 05/19/22 1555 82     Resp 05/19/22 1555 18     Temp 05/19/22 1555 98.3 F (36.8 C)     Temp src --      SpO2 05/19/22 1555 97 %     Weight --      Height --  Head Circumference --      Peak Flow --      Pain Score 05/19/22 1641 0     Pain Loc --      Pain Edu? --      Excl. in [place]? --    No data found.  Updated Vital Signs BP (!) 145/89   Pulse 82   Temp 98.3 F (36.8 C)   Resp 18   SpO2 97%   Visual Acuity Right Eye Distance:   Left Eye Distance:   Bilateral Distance:    Right Eye Near:   Left Eye Near:    Bilateral Near:     Physical Exam Vitals reviewed.  Constitutional:      General: He is not in acute distress.    Appearance: Normal appearance. He is not ill-appearing.  HENT:     Head: Normocephalic and atraumatic.      Mouth/Throat:     Mouth: Mucous membranes are moist.     Comments: Moist mucous membranes Eyes:     Extraocular Movements: Extraocular movements intact.     Pupils: Pupils are equal, round, and reactive to light.  Cardiovascular:     Rate and Rhythm: Normal rate and regular rhythm.     Heart sounds: Normal heart sounds.  Pulmonary:     Effort: Pulmonary effort is normal.     Breath sounds: Normal breath sounds. No wheezing, rhonchi or rales.  Abdominal:     General: Bowel sounds are normal. There is no distension.     Palpations: Abdomen is soft. There is no mass.     Tenderness: There is abdominal tenderness in the suprapubic area. There is no right CVA tenderness, left CVA tenderness, guarding or rebound. Negative signs include Murphy's sign, Rovsing's sign and McBurney's sign.     Comments: There is some suprapubic tenderness to deep palpation, without guarding or rebound.   Skin:    General: Skin is warm.     Capillary Refill: Capillary refill takes less than 2 seconds.     Comments: Good skin turgor  Neurological:     General: No focal deficit present.     Mental Status: He is alert and oriented to person, place, and time.  Psychiatric:        Mood and Affect: Mood normal.        Behavior: Behavior normal.     UC Treatments / Results  Labs (all labs ordered are listed, but only abnormal results are displayed) Labs Reviewed  RPR  HIV ANTIBODY (ROUTINE TESTING W REFLEX)  POCT URINALYSIS DIPSTICK, ED / UC  CYTOLOGY, (ORAL, ANAL, URETHRAL) ANCILLARY ONLY    EKG   Radiology No results found.  Procedures Procedures (including critical care time)  Medications Ordered in UC Medications - No data to display  Initial Impression / Assessment and Plan / UC Course  I have reviewed the triage vital signs and the nursing notes.  Pertinent labs & imaging results that were available during my care of the patient were reviewed by me and considered in my medical decision  making (see chart for details).     This patient is a very pleasant 26 y.o. year old male presenting with dysuria following new male partner. There is some lower abd pain, but no guarding or rebound. Suspect this may actually be related to indigestion/ dietary changes. The back pain is of the lumbar paraspinous muscles; there is no midline lower back pain, and UA is wnl, so low concern for  acute prostatitis.  Will send self-swab for G/C, trich, yeast; also sent HIV, RPR. Safe sex precautions.   Will wait to treat as he is penicillin allergic.  ED return precautions discussed. Patient verbalizes understanding and agreement.   .   Final Clinical Impressions(s) / UC Diagnoses   Final diagnoses:  Routine screening for STI (sexually transmitted infection)     Discharge Instructions      -We have sent testing for sexually transmitted infections. We will notify you of any positive results once they are received. If required, we will prescribe any medications you might need. Please refrain from all sexual activity until treatment is complete.  -Seek additional medical attention if you develop fevers/chills, new/worsening abdominal pain, new/worsening penile discomfort/discharge, etc.     ED Prescriptions   None    PDMP not reviewed this encounter.   Hazel Sams, PA-C 05/19/22 1700

## 2022-05-19 NOTE — ED Triage Notes (Signed)
Pt is present today with lower abdominal pain, dysuria, lower back, and urinary frequency. Pt sx started one week ago.   Pt states that he would also like to be tested for STD and HIV. Pt denies any sx

## 2022-05-20 LAB — RPR: RPR Ser Ql: NONREACTIVE

## 2022-05-22 ENCOUNTER — Ambulatory Visit (HOSPITAL_COMMUNITY): Payer: Self-pay

## 2022-05-22 LAB — CYTOLOGY, (ORAL, ANAL, URETHRAL) ANCILLARY ONLY
Chlamydia: NEGATIVE
Comment: NEGATIVE
Comment: NEGATIVE
Comment: NORMAL
Neisseria Gonorrhea: NEGATIVE
Trichomonas: NEGATIVE

## 2022-10-14 ENCOUNTER — Other Ambulatory Visit: Payer: Self-pay

## 2022-10-14 ENCOUNTER — Ambulatory Visit (HOSPITAL_COMMUNITY)
Admission: EM | Admit: 2022-10-14 | Discharge: 2022-10-14 | Disposition: A | Discharge status: Home / Self Care | Payer: Self-pay | Attending: Internal Medicine | Admitting: Internal Medicine

## 2022-10-14 ENCOUNTER — Encounter (HOSPITAL_COMMUNITY): Payer: Self-pay | Admitting: *Deleted

## 2022-10-14 DIAGNOSIS — Z113 Encounter for screening for infections with a predominantly sexual mode of transmission: Secondary | ICD-10-CM | POA: Insufficient documentation

## 2022-10-14 DIAGNOSIS — R300 Dysuria: Secondary | ICD-10-CM | POA: Insufficient documentation

## 2022-10-14 LAB — HIV ANTIBODY (ROUTINE TESTING W REFLEX): HIV Screen 4th Generation wRfx: NONREACTIVE

## 2022-10-14 LAB — POCT URINALYSIS DIPSTICK, ED / UC
Bilirubin Urine: NEGATIVE
Glucose, UA: NEGATIVE mg/dL
Hgb urine dipstick: NEGATIVE
Ketones, ur: 40 mg/dL — AB
Leukocytes,Ua: NEGATIVE
Nitrite: NEGATIVE
Protein, ur: NEGATIVE mg/dL
Specific Gravity, Urine: 1.025 (ref 1.005–1.030)
Urobilinogen, UA: 0.2 mg/dL (ref 0.0–1.0)
pH: 6 (ref 5.0–8.0)

## 2022-10-14 MED ORDER — VALACYCLOVIR HCL 500 MG PO TABS: 500.0000 mg | ORAL_TABLET | Freq: Two times a day (BID) | ORAL | 0 refills | Status: AC

## 2022-10-14 NOTE — ED Provider Notes (Signed)
[place]    CSN: [id number] Arrival date & time: 10/14/22  1310      History   Chief Complaint Chief Complaint  Patient presents with   SEXUALLY TRANSMITTED DISEASE   Penile Discharge    HPI Walter Sparks is a 26 y.o. male.   Patient presents urgent care for evaluation of yellow penile discharge that started 2 days ago. Patient also reports experiencing a genital herpes outbreak starting 2 weeks ago and is managing this with over-the-counter herbs and supplements.  He is requesting a prescription for Valtrex for future outbreaks and understands that this medication will likely not help in his current outbreak since it has been 2 to 3 weeks since onset.  Lesions are improving.  Denies recent sexual intercourse and states last intercourse was 1.5 months ago.  He is unsure of the cause of the yellow penile discharge.  No known exposure to STI.  Denies abdominal pain, nausea, vomiting, diarrhea, constipation, and fever/chills.  He is reporting some dysuria in association with the penile discharge.  Denies hematuria, hesitancy, urinary frequency, and urgency.  Denies excessive intake of urinary irritants.  He has not attempted use of any over-the-counter medications prior to arrival urgent care for his penile discharge.   Penile Discharge    Past Medical History:  Diagnosis Date   Seizures [facility])     Patient Active Problem List   Diagnosis Date Noted   Localization-related idiopathic epilepsy and epileptic syndromes with seizures of localized onset, not intractable, with status epilepticus ([place]) 01/24/2016   Possible exposure to STD 10/08/2013   Tobacco abuse counseling 10/08/2013   ATTENTION DEFICIT DISORDER 07/07/2008    History reviewed. No pertinent surgical history.     Home Medications    Prior to Admission medications   Medication Sig Start Date End Date Taking? Authorizing Provider  valACYclovir (VALTREX) 500 MG tablet Take 1 tablet (500 mg total) by  mouth 2 (two) times daily for 3 days. 10/14/22 10/17/22 Yes Talbot Grumbling, FNP  albuterol (VENTOLIN HFA) 108 (90 Base) MCG/ACT inhaler Inhale 1-2 puffs into the lungs every 6 (six) hours as needed for wheezing or shortness of breath. 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  Per HPI   Physical Exam Triage Vital Signs ED Triage Vitals  Enc Vitals Group     BP 10/14/22 1429 (!) 144/73     Pulse Rate 10/14/22 1429 73     Resp 10/14/22 1429 18     Temp 10/14/22 1429 98.5 F (36.9 C)     Temp src --      SpO2 10/14/22 1429 96 %     Weight --  Height --      Head Circumference --      Peak Flow --      Pain Score 10/14/22 1427 4     Pain Loc --      Pain Edu? --      Excl. in [place]? --    No data found.  Updated Vital Signs BP (!) 144/73   Pulse 73   Temp 98.5 F (36.9 C)   Resp 18   SpO2 96%   Visual Acuity Right Eye Distance:   Left Eye Distance:   Bilateral Distance:    Right Eye Near:   Left Eye Near:    Bilateral Near:     Physical Exam Vitals and nursing note reviewed.  Constitutional:      Appearance: He is not ill-appearing or toxic-appearing.  HENT:     Head: Normocephalic and atraumatic.     Right Ear: Hearing, tympanic membrane, ear canal and external ear normal.     Left Ear:  Hearing, tympanic membrane, ear canal and external ear normal.     Nose: Nose normal.     Mouth/Throat:     Lips: Pink.  Eyes:     General: Lids are normal. Vision grossly intact. Gaze aligned appropriately.     Extraocular Movements: Extraocular movements intact.     Conjunctiva/sclera: Conjunctivae normal.  Cardiovascular:     Rate and Rhythm: Normal rate and regular rhythm.     Heart sounds: Normal heart sounds, S1 normal and S2 normal.  Pulmonary:     Effort: Pulmonary effort is normal. No respiratory distress.     Breath sounds: Normal breath sounds and air entry.  Genitourinary:    Comments: Deferred. Musculoskeletal:     Cervical back: Neck supple.  Skin:    General: Skin is warm and dry.     Capillary Refill: Capillary refill takes less than 2 seconds.     Findings: No rash.  Neurological:     General: No focal deficit present.     Mental Status: He is alert and oriented to person, place, and time. Mental status is at baseline.     Cranial Nerves: No dysarthria or facial asymmetry.  Psychiatric:        Mood and Affect: Mood normal.        Speech: Speech normal.        Behavior: Behavior normal.        Thought Content: Thought content normal.        Judgment: Judgment normal.      UC Treatments / Results  Labs (all labs ordered are listed, but only abnormal results are displayed) Labs Reviewed  POCT URINALYSIS DIPSTICK, ED / UC - Abnormal; Notable for the following components:      Result Value   Ketones, ur 40 (*)    All other components within normal limits  HIV ANTIBODY (ROUTINE TESTING W REFLEX)  RPR  CYTOLOGY, (ORAL, ANAL, URETHRAL) ANCILLARY ONLY    EKG   Radiology No results found.  Procedures Procedures (including critical care time)  Medications Ordered in UC Medications - No data to display  Initial Impression / Assessment and Plan / UC Course  I have reviewed the triage vital signs and the nursing notes.  Pertinent labs & imaging  results that were available during my care of the patient were reviewed by me and considered in my medical decision making (see chart for details).   1.  Screen for STD and dysuria Urinalysis is  unremarkable for evidence of urinary tract infection or white blood cells.  There is no indication for culture as I have a low suspicion for urinary tract infection because of dysuria.  Patient likely has an STD.  STD screening is pending and we will treat based on results.  Patient aware that he will have to come back for treatment if he is positive for gonorrhea.  Advised to avoid sexual intercourse for the next 7 days while receiving treatment.  We will call in the next 2 to 3 days once results come back. Discussed condom use in the future to prevent spread of STDs.   Discussed physical exam and available lab work findings in clinic with patient.  Counseled patient regarding appropriate use of medications and potential side effects for all medications recommended or prescribed today. Discussed red flag signs and symptoms of worsening condition,when to call the PCP office, return to urgent care, and when to seek higher level of care in the emergency department. Patient verbalizes understanding and agreement with plan. All questions answered. Patient discharged in stable condition.    Final Clinical Impressions(s) / UC Diagnoses   Final diagnoses:  Screen for STD (sexually transmitted disease)  Dysuria     Discharge Instructions      Your STD testing has been sent to the lab and will come back in the next 2 to 3 days.  We will call you if any of your results are positive requiring treatment and treat you at that time.   Avoid sexual intercourse until your STD results come back.  If any of your STD results are positive, you will need to avoid sexual intercourse for 7 days while you are being treated to prevent spread of STD.  Condom use is the best way to prevent spread of STDs.  Course of  valacyclovir sent to pharmacy should you need it in case of another herpes outbreak in the future.  Attempt to take this medicine upon first noticing the lesion for it to work best in your body.  Avoid sexual intercourse while you are experiencing an outbreak for at least 7 days.  Return to urgent care as needed.      ED Prescriptions     Medication Sig Dispense Auth. Provider   valACYclovir (VALTREX) 500 MG tablet Take 1 tablet (500 mg total) by mouth 2 (two) times daily for 3 days. 6 tablet Talbot Grumbling, FNP      PDMP not reviewed this encounter.   Talbot Grumbling, [place] 10/14/22 1556

## 2022-10-14 NOTE — ED Triage Notes (Signed)
Pt reports his urine is dark and he has a discharge from Penis. Pt requesting a STD test.

## 2022-10-14 NOTE — Discharge Instructions (Addendum)
Your STD testing has been sent to the lab and will come back in the next 2 to 3 days.  We will call you if any of your results are positive requiring treatment and treat you at that time.   Avoid sexual intercourse until your STD results come back.  If any of your STD results are positive, you will need to avoid sexual intercourse for 7 days while you are being treated to prevent spread of STD.  Condom use is the best way to prevent spread of STDs.  Course of valacyclovir sent to pharmacy should you need it in case of another herpes outbreak in the future.  Attempt to take this medicine upon first noticing the lesion for it to work best in your body.  Avoid sexual intercourse while you are experiencing an outbreak for at least 7 days.  Return to urgent care as needed.

## 2022-10-15 ENCOUNTER — Ambulatory Visit (HOSPITAL_COMMUNITY)
Admission: EM | Admit: 2022-10-15 | Discharge: 2022-10-15 | Disposition: A | Discharge status: Home / Self Care | Payer: Self-pay | Attending: Physician Assistant | Admitting: Physician Assistant

## 2022-10-15 DIAGNOSIS — N342 Other urethritis: Secondary | ICD-10-CM

## 2022-10-15 LAB — RPR: RPR Ser Ql: NONREACTIVE

## 2022-10-15 MED ORDER — CEFTRIAXONE SODIUM 250 MG IJ SOLR
500.0000 mg | Freq: Once | INTRAMUSCULAR | Status: AC
  Administered 2022-10-15: 500 mg via INTRAMUSCULAR

## 2022-10-15 MED ORDER — DOXYCYCLINE HYCLATE 100 MG PO CAPS: 100.0000 mg | ORAL_CAPSULE | Freq: Two times a day (BID) | ORAL | 0 refills | Status: DC

## 2022-10-15 MED ORDER — LIDOCAINE HCL (PF) 1 % IJ SOLN
INTRAMUSCULAR | Status: AC
  Filled 2022-10-15: qty 2

## 2022-10-15 MED ORDER — CEFTRIAXONE SODIUM 500 MG IJ SOLR
INTRAMUSCULAR | Status: AC
  Filled 2022-10-15: qty 500

## 2022-10-15 NOTE — ED Provider Notes (Signed)
[place]    CSN: [id number] Arrival date & time: 10/15/22  1617      History   Chief Complaint Chief Complaint  Patient presents with   SEXUALLY TRANSMITTED DISEASE    Entered by patient    HPI Walter Sparks is a 26 y.o. male.   Patient was seen here yesterday he has test for gonorrhea and chlamydia pending patient is requesting treatment for both.  Patient reports he has had previous STDs and this feels like chlamydia.     Past Medical History:  Diagnosis Date   Seizures [facility])     Patient Active Problem List   Diagnosis Date Noted   Localization-related idiopathic epilepsy and epileptic syndromes with seizures of localized onset, not intractable, with status epilepticus ([place]) 01/24/2016   Possible exposure to STD 10/08/2013   Tobacco abuse counseling 10/08/2013   ATTENTION DEFICIT DISORDER 07/07/2008    No past surgical history on file.     Home Medications    Prior to Admission medications   Medication Sig Start Date End Date Taking? Authorizing Provider  albuterol (VENTOLIN HFA) 108 (90 Base) MCG/ACT inhaler Inhale 1-2 puffs into the lungs every 6 (six) hours as needed for wheezing or shortness of breath. 02/08/22   Crain, Whitney L, PA  valACYclovir (VALTREX) 500 MG tablet Take 1 tablet (500 mg total) by mouth 2 (two) times daily for 3 days. 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    Physical Exam Triage Vital Signs ED Triage Vitals [10/15/22 1710]  Enc Vitals Group     BP (!) 151/90     Pulse Rate 62     Resp 16     Temp 98 F (36.7 C)     Temp Source Oral     SpO2 98 %     Weight      Height      Head Circumference      Peak Flow      Pain Score      Pain Loc      Pain Edu?      Excl. in [place]?    No data found.  Updated Vital Signs BP (!) 151/90 (BP Location: Left Arm)   Pulse 62   Temp 98 F (36.7 C) (Oral)   Resp 16   SpO2 98%   Visual Acuity Right Eye Distance:   Left Eye Distance:   Bilateral Distance:    Right Eye Near:   Left Eye Near:    Bilateral Near:     Physical Exam Vitals and nursing note reviewed.  Constitutional:      Appearance: He is well-developed.  HENT:  Head: Normocephalic.  Cardiovascular:     Rate and Rhythm: Normal rate.  Pulmonary:     Effort: Pulmonary effort is normal.  Abdominal:     General: There is no distension.  Musculoskeletal:        General: Normal range of motion.     Cervical back: Normal range of motion.  Skin:    General: Skin is warm.  Neurological:     General: No focal deficit present.     Mental Status: He is alert and oriented to person, place, and time.      UC Treatments / Results  Labs (all labs ordered are listed, but only abnormal results are displayed) Labs Reviewed - No data to display  EKG   Radiology No results found.  Procedures Procedures (including critical care time)  Medications Ordered in UC Medications  cefTRIAXone (ROCEPHIN) injection 500 mg (has no administration in time range)    Initial Impression / Assessment and Plan / UC Course  I have reviewed the triage vital signs and the nursing notes.  Pertinent labs & imaging results that were available during my care of the patient were reviewed by me and considered in my medical  decision making (see chart for details).      Final Clinical Impressions(s) / UC Diagnoses   Final diagnoses:  Urethritis   Discharge Instructions   None    ED Prescriptions     Medication Sig Dispense Auth. Provider   doxycycline (VIBRAMYCIN) 100 MG capsule Take 1 capsule (100 mg total) by mouth 2 (two) times daily. 20 capsule [name], [place]      PDMP not reviewed this encounter.   [name], [place] 10/15/22 1737

## 2022-10-15 NOTE — ED Triage Notes (Signed)
Pt was seen yesterday. Pt reports his penis is burning and he is having discharge. Pt is requesting to be treated TODAY.

## 2022-10-16 LAB — CYTOLOGY, (ORAL, ANAL, URETHRAL) ANCILLARY ONLY
Chlamydia: NEGATIVE
Comment: NEGATIVE
Comment: NEGATIVE
Comment: NORMAL
Neisseria Gonorrhea: NEGATIVE
Trichomonas: NEGATIVE

## 2023-03-02 ENCOUNTER — Ambulatory Visit (HOSPITAL_COMMUNITY): Payer: Self-pay

## 2023-03-16 ENCOUNTER — Ambulatory Visit (HOSPITAL_COMMUNITY): Payer: Self-pay

## 2023-03-21 ENCOUNTER — Encounter (HOSPITAL_COMMUNITY): Payer: Self-pay

## 2023-03-21 ENCOUNTER — Ambulatory Visit (HOSPITAL_COMMUNITY)
Admission: RE | Admit: 2023-03-21 | Discharge: 2023-03-21 | Disposition: A | Discharge status: Home / Self Care | Payer: Self-pay | Source: Ambulatory Visit

## 2023-03-21 VITALS — BP 134/83 | HR 68 | Temp 97.8°F | Resp 18 | Ht 70.0 in | Wt 215.0 lb

## 2023-03-21 DIAGNOSIS — Z202 Contact with and (suspected) exposure to infections with a predominantly sexual mode of transmission: Secondary | ICD-10-CM | POA: Insufficient documentation

## 2023-03-21 HISTORY — DX: Herpesviral infection, unspecified: B00.9

## 2023-03-21 LAB — HIV ANTIBODY (ROUTINE TESTING W REFLEX): HIV Screen 4th Generation wRfx: NONREACTIVE

## 2023-03-21 NOTE — ED Provider Notes (Signed)
[place]    CSN: [id number] Arrival date & time: 03/21/23  1457      History   Chief Complaint Chief Complaint  Patient presents with   SEXUALLY TRANSMITTED DISEASE    Entered by patient    HPI Walter Sparks is a 27 y.o. male.   Patient presents to urgent care for routine screening.  Currently asymptomatic.  No recent exposures to STD to his knowledge.  No recent new sexual partners.  States he would like to get tested for future partners.  History of HSV-2, no recent outbreaks.     Past Medical History:  Diagnosis Date   HSV infection    Seizures     Patient Active Problem List   Diagnosis Date Noted   Localization-related idiopathic epilepsy and epileptic syndromes with seizures of localized onset, not intractable, with status epilepticus 01/24/2016   Possible exposure to STD 10/08/2013   Tobacco abuse counseling 10/08/2013   ATTENTION DEFICIT DISORDER 07/07/2008    History reviewed. No pertinent surgical history.     Home Medications    Prior to Admission medications   Medication Sig Start Date End Date Taking? Authorizing Provider  albuterol (VENTOLIN HFA) 108 (90 Base) MCG/ACT inhaler Inhale 1-2 puffs into the lungs every 6 (six) hours as needed for wheezing or shortness of breath. 02/08/22  Yes Walter Sparks  valACYclovir HCl (VALTREX PO) Take by mouth.   Yes [provider]  doxycycline (VIBRAMYCIN) 100 MG capsule Take 1 capsule (100 mg total) by mouth 2 (two) times daily. 10/15/22   Walter Sparks    Family History Family History  Problem Relation Age of Onset   Healthy Mother    Healthy Father     Social History Social History   Tobacco Use   Smoking status: Former    Types: Cigarettes   Smokeless tobacco: Never  Vaping Use   Vaping Use: Every day   Substances: Flavoring  Substance Use Topics   Alcohol use: Yes    Comment: "Occasionally," up to 1-2 times every couple of months   Drug use: Yes     Frequency: 7.0 times per week    Types: Marijuana    Comment: Daily marijuana use with tobacco, reports using ecstasy      Allergies   Penicillins, Sulfa antibiotics, and Amoxicillin   Review of Systems Review of Systems Per HPI  Physical Exam Triage Vital Signs ED Triage Vitals  Enc Vitals Group     BP 03/21/23 1520 134/83     Pulse Rate 03/21/23 1520 68     Resp 03/21/23 1520 18     Temp 03/21/23 1520 97.8 F (36.6 C)     Temp Source 03/21/23 1520 Oral     SpO2 03/21/23 1520 94 %     Weight 03/21/23 1520 215 lb (97.5 kg)     Height 03/21/23 1520 5\' 10"  (1.778 m)     Head Circumference --      Peak Flow --      Pain Score 03/21/23 1516 0     Pain Loc --      Pain Edu? --      Excl. in [place]? --    No data found.  Updated Vital Signs BP 134/83 (BP Location: Left Arm)   Pulse 68   Temp 97.8 F (36.6 C) (Oral)   Resp 18   Ht 5\' 10"  (1.778 m)   Wt 215 lb (97.5 kg)   SpO2  94%   BMI 30.85 kg/m   Visual Acuity Right Eye Distance:   Left Eye Distance:   Bilateral Distance:    Right Eye Near:   Left Eye Near:    Bilateral Near:     Physical Exam Vitals and nursing note reviewed.  Constitutional:      Appearance: He is not ill-appearing or toxic-appearing.  HENT:     Head: Normocephalic and atraumatic.     Right Ear: Hearing and external ear normal.     Left Ear: Hearing and external ear normal.     Nose: Nose normal.     Mouth/Throat:     Lips: Pink.  Eyes:     General: Lids are normal. Vision grossly intact. Gaze aligned appropriately.     Extraocular Movements: Extraocular movements intact.     Conjunctiva/sclera: Conjunctivae normal.  Pulmonary:     Effort: Pulmonary effort is normal.  Musculoskeletal:     Cervical back: Neck supple.  Skin:    General: Skin is warm and dry.     Capillary Refill: Capillary refill takes less than 2 seconds.     Findings: No rash.  Neurological:     General: No focal deficit present.     Mental Status: He is  alert and oriented to person, place, and time. Mental status is at baseline.     Cranial Nerves: No dysarthria or facial asymmetry.  Psychiatric:        Mood and Affect: Mood normal.        Speech: Speech normal.        Behavior: Behavior normal.        Thought Content: Thought content normal.        Judgment: Judgment normal.      UC Treatments / Results  Labs (all labs ordered are listed, but only abnormal results are displayed) Labs Reviewed  HIV ANTIBODY (ROUTINE TESTING W REFLEX)  RPR  CYTOLOGY, (ORAL, ANAL, URETHRAL) ANCILLARY ONLY    EKG   Radiology No results found.  Procedures Procedures (including critical care time)  Medications Ordered in UC Medications - No data to display  Initial Impression / Assessment and Plan / UC Course  I have reviewed the triage vital signs and the nursing notes.  Pertinent labs & imaging results that were available during my care of the patient were reviewed by me and considered in my medical decision making (see chart for details).   1.  Possible exposure to STD STI labs pending.  Patient would like HIV and syphilis testing today.  Will notify patient of positive results and treat accordingly when labs come back.  Patient to avoid sexual intercourse until screening testing comes back.  Education provided regarding safe sexual practices and patient encouraged to use protection to prevent spread of STIs.   Discussed physical exam and available lab work findings in clinic with patient.  Counseled patient regarding appropriate use of medications and potential side effects for all medications recommended or prescribed today. Discussed red flag signs and symptoms of worsening condition,when to call the PCP office, return to urgent care, and when to seek higher level of care in the emergency department. Patient verbalizes understanding and agreement with plan. All questions answered. Patient discharged in stable condition.    Final  Clinical Impressions(s) / UC Diagnoses   Final diagnoses:  Possible exposure to STD     Discharge Instructions      Your STD testing has been sent to the lab and will come  back in the next 2 to 3 days.  We will call you if any of your results are positive requiring treatment and treat you at that time.   If you do not receive a phone call from [place], this means your testing was negative.  Avoid sexual intercourse until your STD results come back.  If any of your STD results are positive, you will need to avoid sexual intercourse for 7 days while you are being treated to prevent spread of STD.  Condom use is the best way to prevent spread of STDs.  Return to urgent care as needed.    ED Prescriptions   None    PDMP not reviewed this encounter.   Talbot Grumbling, [place] 03/21/23 1553

## 2023-03-21 NOTE — Discharge Instructions (Signed)

## 2023-03-21 NOTE — ED Triage Notes (Signed)
Routine Std testing. No known exposure or symptoms.

## 2023-03-22 LAB — CYTOLOGY, (ORAL, ANAL, URETHRAL) ANCILLARY ONLY
Chlamydia: NEGATIVE
Comment: NEGATIVE
Comment: NEGATIVE
Comment: NORMAL
Neisseria Gonorrhea: NEGATIVE
Trichomonas: NEGATIVE

## 2023-03-22 LAB — RPR: RPR Ser Ql: NONREACTIVE

## 2023-05-30 ENCOUNTER — Ambulatory Visit (HOSPITAL_COMMUNITY)
Admission: RE | Admit: 2023-05-30 | Discharge: 2023-05-30 | Disposition: A | Discharge status: Home / Self Care | Payer: Self-pay | Source: Ambulatory Visit | Attending: Family Medicine | Admitting: Family Medicine

## 2023-05-30 ENCOUNTER — Encounter (HOSPITAL_COMMUNITY): Payer: Self-pay

## 2023-05-30 VITALS — BP 142/89 | HR 66 | Temp 98.2°F | Resp 18

## 2023-05-30 DIAGNOSIS — A6000 Herpesviral infection of urogenital system, unspecified: Secondary | ICD-10-CM | POA: Insufficient documentation

## 2023-05-30 DIAGNOSIS — Z113 Encounter for screening for infections with a predominantly sexual mode of transmission: Secondary | ICD-10-CM | POA: Insufficient documentation

## 2023-05-30 MED ORDER — VALACYCLOVIR HCL 1 G PO TABS: ORAL_TABLET | ORAL | 0 refills | Status: DC

## 2023-05-30 NOTE — Discharge Instructions (Signed)
We have sent testing for sexually transmitted infections. We will notify you of any positive results once they are received. If required, we will prescribe any medications you might need.  Please refrain from all sexual activity for at least the next seven days.  

## 2023-05-30 NOTE — ED Triage Notes (Signed)
Pt presents to the office for HSV outbreak. Pt is requesting STI testing.

## 2023-05-31 LAB — CYTOLOGY, (ORAL, ANAL, URETHRAL) ANCILLARY ONLY
Chlamydia: NEGATIVE
Comment: NEGATIVE
Comment: NEGATIVE
Comment: NORMAL
Neisseria Gonorrhea: NEGATIVE
Trichomonas: NEGATIVE

## 2023-06-02 NOTE — ED Provider Notes (Signed)
[facility]   [id number] 05/30/23 Arrival Time: 1535  ASSESSMENT & PLAN:  1. Screening for STD (sexually transmitted disease)   2. Genital herpes simplex, unspecified site    Recurrent with acute exacerbation.  Meds ordered this encounter  Medications   valACYclovir (VALTREX) 1000 MG tablet    Sig: Take one tablet daily for 5 days for recurrent HSV outbreaks.    Dispense:  20 tablet    Refill:  0      Discharge Instructions      We have sent testing for sexually transmitted infections. We will notify you of any positive results once they are received. If required, we will prescribe any medications you might need.  Please refrain from all sexual activity for at least the next seven days.     Pending: Labs Reviewed  CYTOLOGY, (ORAL, ANAL, URETHRAL) ANCILLARY ONLY    Will notify of any positive results. Instructed to refrain from sexual activity for at least seven days.  Reviewed expectations re: course of current medical issues. Questions answered. Outlined signs and symptoms indicating need for more acute intervention. Patient verbalized understanding. After Visit Summary given.   SUBJECTIVE:  Walter Sparks is a 27 y.o. male who Pt presents to the office for genital  HSV outbreak; few days; out of Valtrex.. Pt is requesting STI testing. Denies penile discharge. Afebrile.  OBJECTIVE:  Vitals:   05/30/23 1544  BP: (!) 142/89  Pulse: 66  Resp: 18  Temp: 98.2 F (36.8 C)  TempSrc: Oral  SpO2: 98%    General appearance: alert, cooperative, appears stated age and no distress GU: declined Skin: warm and dry Psychological: alert and cooperative; normal mood and affect.    Labs Reviewed  CYTOLOGY, (ORAL, ANAL, URETHRAL) ANCILLARY ONLY    Allergies  Allergen Reactions   Penicillins Hives   Sulfa Antibiotics Hives   Amoxicillin Hives    Past Medical History:  Diagnosis Date   HSV infection    Seizures (HCC)    Family History   Problem Relation Age of Onset   Healthy Mother    Healthy Father    Social History   Socioeconomic History   Marital status: Single    Spouse name: Not on file   Number of children: Not on file   Years of education: Not on file   Highest education level: Not on file  Occupational History   Not on file  Tobacco Use   Smoking status: Former    Types: Cigarettes   Smokeless tobacco: Never  Vaping Use   Vaping Use: Every day   Substances: Flavoring  Substance and Sexual Activity   Alcohol use: Yes    Comment: "Occasionally," up to 1-2 times every couple of months   Drug use: Yes    Frequency: 7.0 times per week    Types: Marijuana    Comment: Daily marijuana use with tobacco, reports using ecstasy    Sexual activity: Yes    Partners: Female    Comment: Uses condoms inconsistently  Other Topics Concern   Not on file  Social History Narrative   Not on file   Social Determinants of Health   Financial Resource Strain: Not on file  Food Insecurity: Not on file  Transportation Needs: Not on file  Physical Activity: Not on file  Stress: Not on file  Social Connections: Not on file  Intimate Partner Violence: Not on file           [place], MD 06/02/23  0858  

## 2023-09-19 ENCOUNTER — Ambulatory Visit (HOSPITAL_COMMUNITY)
Admission: RE | Admit: 2023-09-19 | Discharge: 2023-09-19 | Disposition: A | Discharge status: Home / Self Care | Payer: 59 | Source: Ambulatory Visit | Attending: Sports Medicine | Admitting: Sports Medicine

## 2023-09-19 ENCOUNTER — Encounter (HOSPITAL_COMMUNITY): Payer: Self-pay

## 2023-09-19 VITALS — BP 133/72 | HR 74 | Temp 98.0°F | Resp 16

## 2023-09-19 DIAGNOSIS — R300 Dysuria: Secondary | ICD-10-CM | POA: Insufficient documentation

## 2023-09-19 DIAGNOSIS — K5900 Constipation, unspecified: Secondary | ICD-10-CM | POA: Diagnosis not present

## 2023-09-19 LAB — POCT URINALYSIS DIP (MANUAL ENTRY)
Bilirubin, UA: NEGATIVE
Blood, UA: NEGATIVE
Glucose, UA: NEGATIVE mg/dL
Leukocytes, UA: NEGATIVE
Nitrite, UA: NEGATIVE
Protein Ur, POC: NEGATIVE mg/dL
Spec Grav, UA: >=1.03 — AB (ref 1.010–1.025)
Urobilinogen, UA: 0.2 U/dL
pH, UA: 5.5 (ref 5.0–8.0)

## 2023-09-19 LAB — HIV ANTIBODY (ROUTINE TESTING W REFLEX): HIV Screen 4th Generation wRfx: NONREACTIVE

## 2023-09-19 NOTE — ED Triage Notes (Signed)
Pt presents to the office for lower abdominal pain x 3 days. Pt reports dysuria and penile discharge.

## 2023-09-19 NOTE — Discharge Instructions (Addendum)
We have sent testing for sexually transmitted infections (Gonorrhea/Chlamydia, HIV and Syphilis). We will notify you of any positive results once they are received. If the Gonorrhea returns positive please return for an injection and we will prescribe other medications you might need.   Please refrain from all sexual activity for at least the next seven days and if testing is positive alert recent partners that they need testing as well.  I recommend consistent condom use to decrease the transmission of STIs.  For the constipation, please hydrate well, increase dietary fiber, and regularly exercise. If this is not helpful can consider over the counter stool softners such as MiraLAX.

## 2023-09-19 NOTE — ED Provider Notes (Addendum)
[facility]    CSN: [id number] Arrival date & time: 09/19/23  9518      History   Chief Complaint Chief Complaint  Patient presents with   Urinary Frequency    I'm having like very acidic pee when I go and use the bathroom and my stomach feels bloated. - Entered by patient   Penile Discharge    HPI Walter Sparks is a 27 y.o. male.   He's here with 2-3 days of urinary urgency, dribbling urine, and penile discharge. He has hx of HSV and had + STI screening in the past year and states this feels similar. He denies any penile lesions. He had negative HIV/RPR screening in April and would like repeat testing today.  He also notes over the same time he has had firmer stools and having to strain with BMs. Denies blood in stool.   The history is provided by the patient.  Urinary Frequency Pertinent negatives include no abdominal pain.  Penile Discharge Pertinent negatives include no abdominal pain.    Past Medical History:  Diagnosis Date   HSV infection    Seizures (HCC)     Patient Active Problem List   Diagnosis Date Noted   Localization-related idiopathic epilepsy and epileptic syndromes with seizures of localized onset, not intractable, with status epilepticus (HCC) 01/24/2016   Possible exposure to STD 10/08/2013   Tobacco abuse counseling 10/08/2013   Attention deficit disorder 07/07/2008    History reviewed. No pertinent surgical history.     Home Medications    Prior to Admission medications   Medication Sig Start Date End Date Taking? Authorizing Provider  albuterol (VENTOLIN HFA) 108 (90 Base) MCG/ACT inhaler Inhale 1-2 puffs into the lungs every 6 (six) hours as needed for wheezing or shortness of breath. 02/08/22  Yes Crain, Whitney L, PA  valACYclovir (VALTREX) 1000 MG tablet Take one tablet daily for 5 days for recurrent HSV outbreaks. 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 Negative for abdominal pain, blood in stool and nausea.  Genitourinary:  Positive for frequency and penile discharge.     Physical Exam Triage Vital Signs ED Triage Vitals [09/19/23 0849]  Encounter Vitals Group     BP 133/72     Systolic BP Percentile      Diastolic BP Percentile      Pulse Rate 74     Resp 16     Temp 98 F (36.7 C)     Temp Source Oral     SpO2 97 %     Weight      Height      Head Circumference      Peak Flow  Pain Score      Pain Loc      Pain Education      Exclude from Growth Chart    No data found.  Updated Vital Signs BP 133/72 (BP Location: Left Arm)   Pulse 74   Temp 98 F (36.7 C) (Oral)   Resp 16   SpO2 97%   Visual Acuity Right Eye Distance:   Left Eye Distance:   Bilateral Distance:    Right Eye Near:   Left Eye Near:    Bilateral Near:     Physical Exam Constitutional:      Appearance: Normal appearance. He is normal weight.  Cardiovascular:     Rate and Rhythm: Normal rate and regular rhythm.  Pulmonary:     Effort: Pulmonary effort is normal.  Abdominal:     General: Abdomen is flat. There is no distension.     Palpations: Abdomen is soft.     Tenderness: There is no right CVA tenderness, left CVA tenderness or guarding.     Comments: Suprapubic TTP  Genitourinary:    Comments: Patient  deferrred Skin:    General: Skin is warm.     Findings: No rash.  Neurological:     General: No focal deficit present.     Mental Status: He is alert and oriented to person, place, and time. Mental status is at baseline.  Psychiatric:        Mood and Affect: Mood normal.        Behavior: Behavior normal.        Thought Content: Thought content normal.        Judgment: Judgment normal.      UC Treatments / Results  Labs (all labs ordered are listed, but only abnormal results are displayed) Labs Reviewed  POCT URINALYSIS DIP (MANUAL ENTRY) - Abnormal; Notable for the following components:      Result Value   Ketones, POC UA moderate (40) (*)    Spec Grav, UA >=1.030 (*)    All other components within normal limits  RPR  HIV ANTIBODY (ROUTINE TESTING W REFLEX)  CYTOLOGY, (ORAL, ANAL, URETHRAL) ANCILLARY ONLY    EKG   Radiology No results found.  Procedures Procedures (including critical care time)  Medications Ordered in UC Medications - No data to display  Initial Impression / Assessment and Plan / UC Course  I have reviewed the triage vital signs and the nursing notes.  Pertinent labs & imaging results that were available during my care of the patient were reviewed by me and considered in my medical decision making (see chart for details).  Clinical Course as of 09/19/23 0930  Wed Sep 19, 2023  0902 POC urinalysis dipstick [AC]    Clinical Course User Index [AC] Marisa Cyphers, MD    I reviewed with the patient he likely has sexual transmitted infection.  His urinalysis is not consistent with a urinary tract infection.  He would like to wait for results before proceeding with STI treatment.  We collected gonorrhea/chlamydia, trichomonas testing today.  Patient states he will return if gonorrhea positive for ceftriaxone otherwise we will Rx if + for Trich or Chlamydia.  HIV and RPR collected today as well and will follow-up these results once finalized. Patient  expressed understanding.  We also reviewed conservative management for constipation with exercise, increasing hydration, and increasing dietary fiber as well as as needed stool softeners over-the-counter.  Final Clinical Impressions(s) / UC Diagnoses   Final diagnoses:  Dysuria  Constipation, unspecified constipation type     Discharge Instructions      We have sent testing for sexually transmitted infections (Gonorrhea/Chlamydia, HIV and Syphilis). We will notify you of any positive results once they are received. If the Gonorrhea returns positive please return for an injection and we will prescribe other medications you might need.   Please refrain from all sexual activity for at least the next seven days and if testing is positive alert recent partners that they need testing as well.  I recommend consistent condom use to decrease the transmission of STIs.  For the constipation, please hydrate well, increase dietary fiber, and regularly exercise. If this is not helpful can consider over the counter stool softners such as MiraLAX.    ED Prescriptions   None    PDMP not reviewed this encounter.   Marisa Cyphers, MD 09/19/23 4782    Marisa Cyphers, MD 09/19/23 0930

## 2023-09-20 LAB — RPR: RPR Ser Ql: NONREACTIVE

## 2023-09-24 LAB — CYTOLOGY, (ORAL, ANAL, URETHRAL) ANCILLARY ONLY
Chlamydia: NEGATIVE
Comment: NEGATIVE
Comment: NEGATIVE
Comment: NORMAL
Neisseria Gonorrhea: NEGATIVE
Trichomonas: NEGATIVE

## 2023-11-28 ENCOUNTER — Ambulatory Visit (HOSPITAL_COMMUNITY): Payer: 59

## 2023-12-02 ENCOUNTER — Ambulatory Visit (HOSPITAL_COMMUNITY): Payer: Self-pay

## 2023-12-03 ENCOUNTER — Ambulatory Visit (HOSPITAL_COMMUNITY): Payer: Self-pay

## 2023-12-24 ENCOUNTER — Ambulatory Visit (HOSPITAL_COMMUNITY)
Admission: RE | Admit: 2023-12-24 | Discharge: 2023-12-24 | Disposition: A | Discharge status: Home / Self Care | Payer: 59 | Source: Ambulatory Visit | Attending: Emergency Medicine | Admitting: Emergency Medicine

## 2023-12-24 ENCOUNTER — Encounter (HOSPITAL_COMMUNITY): Payer: Self-pay

## 2023-12-24 VITALS — BP 138/75 | HR 70 | Temp 98.3°F | Resp 16

## 2023-12-24 DIAGNOSIS — Z113 Encounter for screening for infections with a predominantly sexual mode of transmission: Secondary | ICD-10-CM | POA: Diagnosis not present

## 2023-12-24 DIAGNOSIS — Z760 Encounter for issue of repeat prescription: Secondary | ICD-10-CM | POA: Diagnosis not present

## 2023-12-24 MED ORDER — VALACYCLOVIR HCL 1 G PO TABS
1000.0000 mg | ORAL_TABLET | Freq: Every day | ORAL | 3 refills | Status: AC
Start: 2023-12-24 — End: 2024-12-18

## 2023-12-24 MED ORDER — VALACYCLOVIR HCL 1 G PO TABS
1000.0000 mg | ORAL_TABLET | Freq: Every day | ORAL | 11 refills | Status: DC
Start: 2023-12-24 — End: 2023-12-24

## 2023-12-24 NOTE — ED Provider Notes (Signed)
 [facility]    CSN: [id number] Arrival date & time: 12/24/23  1409    HISTORY   Chief Complaint  Patient presents with   SEXUALLY TRANSMITTED DISEASE    Entered by patient   Medication Refill   HPI Walter Sparks is a pleasant, 28 y.o. male who presents to urgent care today. Patient requests routine STD screening.   Patient denies penile discharge, genital lesion , testicular pain or swelling, scrotal pain or swelling, perineal pain, rectal pain, pain with defecation, abdominal pain, and suprapubic pain. Patient requests refill of valacyclovir  for known history of HSV.  The history is provided by the patient.    Past Medical History:  Diagnosis Date   HSV infection    Seizures (HCC)    Patient Active Problem List   Diagnosis Date Noted   Localization-related idiopathic epilepsy and epileptic syndromes with seizures of localized onset, not intractable, with status epilepticus (HCC) 01/24/2016   Possible exposure to STD 10/08/2013   Tobacco abuse counseling 10/08/2013   Attention deficit disorder 07/07/2008   History reviewed. No pertinent surgical history.  Home Medications    Prior to Admission medications   Medication Sig Start Date End Date Taking? Authorizing Provider  albuterol  (VENTOLIN  HFA) 108 (90 Base) MCG/ACT inhaler Inhale 1-2 puffs into the lungs every 6 (six) hours as needed for wheezing or shortness of breath. 02/08/22   Crain, Whitney L, PA  valACYclovir  (VALTREX ) 1000 MG tablet Take one tablet daily for 5 days for recurrent HSV outbreaks. 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  Physical Exam Vital Signs BP 138/75 (BP Location: Left Arm)   Pulse 70   Temp 98.3 F (36.8 C) (Oral)   Resp 16   SpO2 94%   No data found.  Physical Exam Vitals and nursing note reviewed.  Constitutional:      General: He is not in acute distress.    Appearance: Normal appearance. He is not ill-appearing.  HENT:     Head: Normocephalic and atraumatic.  Eyes:     General: Lids are normal.        Right eye: No discharge.        Left eye: No discharge.     Extraocular Movements: Extraocular movements intact.     Conjunctiva/sclera: Conjunctivae normal.     Right eye: Right conjunctiva is not injected.     Left eye: Left conjunctiva is not injected.  Neck:     Trachea: Trachea and phonation normal.  Cardiovascular:     Rate and Rhythm: Normal rate and regular rhythm.     Pulses: Normal pulses.  Heart sounds: Normal heart sounds. No murmur heard.    No friction rub. No gallop.  Pulmonary:     Effort: Pulmonary effort is normal. No accessory muscle usage, prolonged expiration or respiratory distress.     Breath sounds: Normal breath sounds. No stridor, decreased air movement or transmitted upper airway sounds. No decreased breath sounds, wheezing, rhonchi or rales.  Chest:     Chest wall: No tenderness.  Genitourinary:    Comments: Pt politely declines GU exam, pt did provide a penile swab for testing.   Musculoskeletal:        General: Normal range of motion.     Cervical back: Normal range of motion and neck supple. Normal range of motion.  Lymphadenopathy:     Cervical: No  cervical adenopathy.  Skin:    General: Skin is warm and dry.     Findings: No erythema or rash.  Neurological:     General: No focal deficit present.     Mental Status: He is alert and oriented to person, place, and time.  Psychiatric:        Mood and Affect: Mood normal.        Behavior: Behavior normal.     Visual Acuity Right Eye Distance:   Left Eye Distance:   Bilateral Distance:    Right Eye Near:   Left Eye Near:    Bilateral Near:     UC Couse / Diagnostics / Procedures:     Radiology No results found.  Procedures Procedures (including critical care time) EKG  Pending results:  Labs Reviewed  CYTOLOGY, (ORAL, ANAL, URETHRAL) ANCILLARY ONLY    Medications Ordered in UC: Medications - No data to display  UC Diagnoses / Final Clinical Impressions(s)   I have reviewed the triage vital signs and the nursing notes.  Pertinent labs & imaging results that were available during my care of the patient were reviewed by me and considered in my medical decision making (see chart for details).    Final diagnoses:  Screening examination for STD (sexually transmitted disease)  Medication refill   STD screening was performed, patient advised that the results be posted to their MyChart and if any of the results are positive, they will be notified by phone, further treatment will be provided as indicated based on results of STD screening. Patient was advised to abstain from sexual intercourse until that they receive the results of their STD testing.  Patient was also advised to use condoms to protect themselves from STD exposure. Return precautions advised.  Drug allergies reviewed, all questions addressed.   Please see discharge instructions below for details of plan of care as provided to patient. ED Prescriptions     Medication Sig Dispense Auth. Provider   valACYclovir  (VALTREX ) 1000 MG tablet Take 1 tablet (1,000 mg total) by mouth daily. Take one tablet daily for  5 days for recurrent HSV outbreaks. 5 tablet Joesph Shaver Scales, PA-C      PDMP not reviewed this encounter.  Disposition Upon Discharge:  Condition: stable for discharge home  Patient presented with concern for an acute illness with associated systemic symptoms and significant discomfort requiring urgent management. In my opinion, this is a condition that a prudent lay person (someone who possesses an average knowledge of health and medicine) may potentially expect to result in complications if not addressed urgently such as respiratory distress, impairment of bodily function or dysfunction of bodily organs.   As such, the patient has been evaluated and  assessed, work-up was performed and treatment was provided in alignment with urgent care protocols and evidence based medicine.  Patient/parent/caregiver has been advised that the patient may require follow up for further testing and/or treatment if the symptoms continue in spite of treatment, as clinically indicated and appropriate.  Routine symptom specific, illness specific and/or disease specific instructions were discussed with the patient and/or caregiver at length.  Prevention strategies for avoiding STD exposure were also discussed.  The patient will follow up with their current PCP if and as advised. If the patient does not currently have a PCP we will assist them in obtaining one.   The patient may need specialty follow up if the symptoms continue, in spite of conservative treatment and management, for further workup, evaluation, consultation and treatment as clinically indicated and appropriate.  Patient/parent/caregiver verbalized understanding and agreement of plan as discussed.  All questions were addressed during visit.  Please see discharge instructions below for further details of plan.    Discharge Instructions      The results of your STD testing today which screens for gonorrhea, chlamydia, and trichomonas will be  posted to your MyChart account once it is complete.  This typically takes 2 to 4 days.  Please abstain from sexual intercourse of any kind, vaginal, oral or anal, until you have received the results of your STD testing.      If any of your results are abnormal, you will receive a phone call regarding treatment.  Prescriptions, if any are needed, will be provided for you at your pharmacy.      I provided you with a refill of your Valtrex  for your convenience.     Thank you for visiting [facility] today.  We appreciate the opportunity to participate in your care.       This office note has been dictated using Teaching laboratory technician.  Unfortunately, this method of dictation can sometimes lead to typographical or grammatical errors.  I apologize for your inconvenience in advance if this occurs.  Please do not hesitate to reach out to me if clarification is needed.       Joesph Shaver Scales, PA-C 12/24/23 1441

## 2023-12-24 NOTE — ED Triage Notes (Signed)
 Patient is here for std-testing. Denies any symptoms at this time.

## 2023-12-24 NOTE — Discharge Instructions (Addendum)
 The results of your STD testing today which screens for gonorrhea, chlamydia, and trichomonas will be posted to your MyChart account once it is complete.  This typically takes 2 to 4 days.  Please abstain from sexual intercourse of any kind, vaginal, oral or anal, until you have received the results of your STD testing.      If any of your results are abnormal, you will receive a phone call regarding treatment.  Prescriptions, if any are needed, will be provided for you at your pharmacy.      I provided you with a refill of your Valtrex  for your convenience.     Thank you for visiting [facility] today.  We appreciate the opportunity to participate in your care.

## 2023-12-25 LAB — CYTOLOGY, (ORAL, ANAL, URETHRAL) ANCILLARY ONLY
Chlamydia: NEGATIVE
Comment: NEGATIVE
Comment: NEGATIVE
Comment: NORMAL
Neisseria Gonorrhea: NEGATIVE
Trichomonas: NEGATIVE

## 2024-03-22 ENCOUNTER — Ambulatory Visit (HOSPITAL_COMMUNITY)

## 2024-07-08 ENCOUNTER — Encounter (HOSPITAL_COMMUNITY): Payer: Self-pay

## 2024-07-08 ENCOUNTER — Ambulatory Visit (HOSPITAL_COMMUNITY)
Admission: RE | Admit: 2024-07-08 | Discharge: 2024-07-08 | Disposition: A | Discharge status: Home / Self Care | Payer: Self-pay | Source: Ambulatory Visit | Attending: Family Medicine | Admitting: Family Medicine

## 2024-07-08 VITALS — BP 143/94 | HR 73 | Temp 98.4°F | Resp 16

## 2024-07-08 DIAGNOSIS — R350 Frequency of micturition: Secondary | ICD-10-CM | POA: Diagnosis present

## 2024-07-08 LAB — POCT URINALYSIS DIP (MANUAL ENTRY)
Bilirubin, UA: NEGATIVE
Blood, UA: NEGATIVE
Glucose, UA: NEGATIVE mg/dL
Ketones, POC UA: NEGATIVE mg/dL
Leukocytes, UA: NEGATIVE
Nitrite, UA: NEGATIVE
Protein Ur, POC: NEGATIVE mg/dL
Spec Grav, UA: 1.025 (ref 1.010–1.025)
Urobilinogen, UA: 0.2 U/dL
pH, UA: 6 (ref 5.0–8.0)

## 2024-07-08 LAB — POCT FASTING CBG KUC MANUAL ENTRY: POCT Glucose (KUC): 109 mg/dL — AB (ref 70–99)

## 2024-07-08 NOTE — ED Triage Notes (Signed)
 Patient here today with c/o burning in urination, urinary frequency, and lower abd pain X 1 week.   Patient would also like to be tested for all Stds.

## 2024-07-08 NOTE — Discharge Instructions (Addendum)
 We have sent testing for sexually transmitted infections. We will notify you of any positive results once they are received. If required, we will prescribe any medications you might need.  Please refrain from all sexual activity for at least the next seven days.

## 2024-07-08 NOTE — ED Provider Notes (Signed)
 [facility]    ASSESSMENT & PLAN:  1. Urinary frequency    Results for orders placed or performed during the hospital encounter of 07/08/24  POC urinalysis dipstick   Collection Time: 07/08/24 12:59 PM  Result Value Ref Range   Color, UA yellow yellow   Clarity, UA clear clear   Glucose, UA negative negative mg/dL   Bilirubin, UA negative negative   Ketones, POC UA negative negative mg/dL   Spec Grav, UA 8.974 8.989 - 1.025   Blood, UA negative negative   pH, UA 6.0 5.0 - 8.0   Protein Ur, POC negative negative mg/dL   Urobilinogen, UA 0.2 0.2 or 1.0 E.U./dL   Nitrite, UA Negative Negative   Leukocytes, UA Negative Negative  POC CBG monitoring   Collection Time: 07/08/24  1:04 PM  Result Value Ref Range   POCT Glucose ([facility]) 109 (A) 70 - 99 mg/dL   Labs Pending:  CYTOLOGY, (ORAL, ANAL, URETHRAL) ANCILLARY ONLY       Discharge Instructions      We have sent testing for sexually transmitted infections. We will notify you of any positive results once they are received. If required, we will prescribe any medications you might need.  Please refrain from all sexual activity for at least the next seven days.      Outlined signs and symptoms indicating need for more acute intervention. Patient verbalized understanding. After Visit Summary given.  SUBJECTIVE:  Walter Sparks is a 28 y.o. male who complains of urinary frequency along with mild burning during  urination. Feel a pressure before I pee. Denies specific abd pain/n/v. Normal PO intake. Reports he is sexually active with multiple partners and also works as a Designer, television/film set on the side. Denies penile discharge.  OBJECTIVE:  Vitals:   07/08/24 1146  BP: (!) 143/94  Pulse: 73  Resp: 16  Temp: 98.4 F (36.9 C)  TempSrc: Oral  SpO2: 98%   General appearance: alert; no distress Abdomen: soft/NT Back: no CVA tenderness GU: deferred Extremities: no edema; symmetrical with no gross  deformities Skin: warm and dry Neurologic: normal gait Psychological: alert and cooperative; normal mood and affect  Labs Reviewed  POCT FASTING CBG [facility] MANUAL ENTRY - Abnormal; Notable for the following components:      Result Value   POCT Glucose ([facility]) 109 (*)    All other components within normal limits  POCT URINALYSIS DIP (MANUAL ENTRY)  CYTOLOGY, (ORAL, ANAL, URETHRAL) ANCILLARY ONLY    Allergies  Allergen Reactions   Penicillins Hives   Sulfa Antibiotics Hives   Amoxicillin Hives    Past Medical History:  Diagnosis Date   HSV infection    Seizures (HCC)    Social History   Socioeconomic History   Marital status: Single    Spouse name: Not on file   Number of children: Not on file   Years of education: Not on file   Highest education level: Not on file  Occupational History   Not on file  Tobacco Use   Smoking status: Every Day    Types: Cigarettes   Smokeless tobacco: Never  Vaping Use   Vaping status: Former   Substances: Flavoring  Substance and Sexual Activity   Alcohol use: Yes    Comment: Occasionally, up to 1-2 times every couple of months   Drug use: Yes    Frequency: 7.0 times per week    Types: Marijuana    Comment: Daily marijuana use with tobacco, reports  using ecstasy    Sexual activity: Yes    Partners: Female    Comment: Uses condoms inconsistently  Other Topics Concern   Not on file  Social History Narrative   Not on file   Social Drivers of Health   Financial Resource Strain: Not on file  Food Insecurity: Not on file  Transportation Needs: Not on file  Physical Activity: Not on file  Stress: Not on file  Social Connections: Not on file  Intimate Partner Violence: Not on file   Family History  Problem Relation Age of Onset   Healthy Mother    Healthy Father         Rolinda Rogue, MD 07/08/24 [phone]

## 2024-07-09 LAB — CYTOLOGY, (ORAL, ANAL, URETHRAL) ANCILLARY ONLY
Chlamydia: NEGATIVE
Comment: NEGATIVE
Comment: NEGATIVE
Comment: NORMAL
Neisseria Gonorrhea: NEGATIVE
Trichomonas: NEGATIVE

## 2024-08-14 ENCOUNTER — Telehealth: Admitting: Physician Assistant

## 2024-08-14 DIAGNOSIS — Z79899 Other long term (current) drug therapy: Secondary | ICD-10-CM

## 2024-08-14 NOTE — Patient Instructions (Signed)
  Walter Sparks, thank you for joining Walter Walter Dickinson, PA-C for today's virtual visit.  While this provider is not your primary care provider (PCP), if your PCP is located in our provider database this encounter information will be shared with them immediately following your visit.   A [facility] MyChart account gives you access to today's visit and all your visits, tests, and labs performed at [facility]  click here if you don't have a [facility] MyChart account or go to [email or web address]  Consent: (Patient) TRUE [name] provided verbal consent for this virtual visit at the beginning of the encounter.  Current Medications:  Current Outpatient Medications:    valACYclovir  (VALTREX ) 1000 MG tablet, Take 1 tablet (1,000 mg total) by mouth daily. Take one tablet daily for 5 days for recurrent HSV outbreaks., Disp: 90 tablet, Rfl: 3   Medications ordered in this encounter:  No orders of the defined types were placed in this encounter.    *If you need refills on other medications prior to your next appointment, please contact your pharmacy*  Follow-Up: Call back or seek an in-person evaluation if the symptoms worsen or if the condition fails to improve as anticipated.  [facility] [phone]   If you have been instructed to have an in-person evaluation today at a local Urgent Care facility, please use the link below. It will take you to a list of all of our available [facility], including address, phone number and hours of operation. Please do not delay care.  [facility]  If you or a family member do not have a primary care provider, use the link below to schedule a visit and establish care. When you choose a [facility] primary care physician or advanced practice provider, you gain a long-term partner in health. Find a Primary Care Provider  Learn more about [facility]'s in-office and virtual care options: [facility] - Get Care Now

## 2024-08-14 NOTE — Progress Notes (Signed)
 Medication management:  Patient has Rx for Valtrex  1000mg  daily #90 with 3RF sent on 12/24/23. However pharmacy has only been refilling for #5 tablets at a time. Advised him that this is not how the Rx was sent and that he should discuss with the pharmacy because it may be that his insurance only covers #5 tablets, or they may not have in stock.   He voiced understanding and is going to follow up with the pharmacy on why it is not filled as prescribed.  No Charge.

## 2024-08-19 ENCOUNTER — Ambulatory Visit (HOSPITAL_COMMUNITY)

## 2024-08-19 ENCOUNTER — Ambulatory Visit
Admission: RE | Admit: 2024-08-19 | Discharge: 2024-08-19 | Disposition: A | Discharge status: Home / Self Care | Attending: Physician Assistant | Admitting: Physician Assistant

## 2024-08-19 ENCOUNTER — Other Ambulatory Visit: Payer: Self-pay

## 2024-08-19 VITALS — BP 134/81 | HR 80 | Temp 98.4°F | Resp 20 | Ht 70.0 in | Wt 215.0 lb

## 2024-08-19 DIAGNOSIS — N489 Disorder of penis, unspecified: Secondary | ICD-10-CM | POA: Diagnosis not present

## 2024-08-19 MED ORDER — MUPIROCIN 2 % EX OINT: 1.0000 | TOPICAL_OINTMENT | Freq: Two times a day (BID) | CUTANEOUS | 0 refills | Status: AC

## 2024-08-19 NOTE — ED Provider Notes (Signed)
 [facility]    CSN: [id number] Arrival date & time: 08/19/24  0932      History   Chief Complaint Chief Complaint  Patient presents with   Skin Problem    HPI Walter Sparks is a 28 y.o. male.   HPI Pt is here for concerns of an open wound in the genital area, reports it is on the penis.  He states this has been ongoing for about 6 days He reports there is serosanguinous  drainage and swelling. He thinks the swelling has improved since it started.  He reports pain in the area. 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  He states he is not sexually active currently  He states this started off with dry skin and rubbing/chaffing from clothing made it worse. He tried cleaning the area with alcohol but this caused more pain.  He does admit to increased stress lately - reports family health concerns and interpersonal relationship concerns.   Past Medical History:  Diagnosis Date   HSV infection    Seizures (HCC)     Patient Active Problem List   Diagnosis Date Noted   Localization-related idiopathic epilepsy and epileptic syndromes with seizures of localized onset, not intractable, with status epilepticus (HCC) 01/24/2016   Possible exposure to STD 10/08/2013   Tobacco abuse counseling 10/08/2013   Attention deficit disorder 07/07/2008    History reviewed. No pertinent surgical history.     Home Medications    Prior to Admission medications   Medication Sig Start Date End Date Taking? Authorizing Provider  mupirocin  ointment (BACTROBAN ) 2 % Apply 1 Application topically 2 (two) times daily for 7 days. 08/19/24 08/26/24 Yes Lev Cervone E, PA-C  valACYclovir  (VALTREX ) 1000 MG tablet Take 1 tablet (1,000 mg total) by mouth daily. Take one tablet daily for 5 days for recurrent HSV outbreaks. 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     Physical Exam Triage Vital Signs ED Triage Vitals  Encounter Vitals Group     BP 08/19/24 0946 134/81     Girls Systolic BP Percentile --      Girls Diastolic BP Percentile --      Boys Systolic BP Percentile --      Boys Diastolic BP Percentile --      Pulse Rate 08/19/24 0946 80     Resp 08/19/24 0946 20     Temp 08/19/24 0946 98.4 F (36.9 C)     Temp Source 08/19/24 0946 Oral     SpO2 08/19/24 0946 95 %     Weight 08/19/24 0948 215 lb (97.5 kg)  Height 08/19/24 0948 5' 10 (1.778 m)     Head Circumference --      Peak Flow --      Pain Score 08/19/24 0947 7     Pain Loc --      Pain Education --      Exclude from Growth Chart --    No data found.  Updated Vital Signs BP 134/81 (BP Location: Right Arm)   Pulse 80   Temp 98.4 F (36.9 C) (Oral)   Resp 20   Ht 5' 10 (1.778 m)   Wt 215 lb (97.5 kg)   SpO2 95%   BMI 30.85 kg/m   Visual Acuity Right Eye Distance:   Left Eye Distance:   Bilateral Distance:    Right Eye Near:   Left Eye Near:    Bilateral Near:     Physical Exam Vitals reviewed.  Constitutional:      General: He is awake. He is not in acute distress.    Appearance: Normal appearance. He is well-developed and well-groomed. He is not ill-appearing, toxic-appearing or diaphoretic.  HENT:     Head: Normocephalic and atraumatic.  Eyes:      Extraocular Movements: Extraocular movements intact.     Conjunctiva/sclera: Conjunctivae normal.  Pulmonary:     Effort: Pulmonary effort is normal.  Genitourinary:    Penis: Circumcised. Lesions present.      Tanner stage (genital): 5.       Comments: Pt declines chaperone for exam Musculoskeletal:     Cervical back: Normal range of motion.  Neurological:     Mental Status: He is alert and oriented to person, place, and time.  Psychiatric:        Attention and Perception: Attention normal.        Mood and Affect: Mood normal.        Speech: Speech normal.        Behavior: Behavior normal. Behavior is cooperative.      UC Treatments / Results  Labs (all labs ordered are listed, but only abnormal results are displayed) Labs Reviewed - No data to display  EKG   Radiology No results found.  Procedures Procedures (including critical care time)  Medications Ordered in UC Medications - No data to display  Initial Impression / Assessment and Plan / UC Course  I have reviewed the triage vital signs and the nursing notes.  Pertinent labs & imaging results that were available during my care of the patient were reviewed by me and considered in my medical decision making (see chart for details).      Final Clinical Impressions(s) / UC Diagnoses   Final diagnoses:  Penile lesion  Patient presents today with concerns for a lesion on the dorsal aspect of the penis that has been ongoing for about 6 days.  He reports that there has been some serosanguineous drainage as well as swelling but he thinks that this is improving.  He reports that he has a previous history of HSV and has been taking Valtrex  daily since lesion appeared.  Patient declines chaperone.  Physical exam is notable for approximately 1 cm diameter papular lesion with excoriated center.  Offered to swab for HSV for confirmatory rule out but patient declines at this stating that he thinks it is HSV.  Recommend the  patient continues Valtrex  per prescription directions.  Will send patient home with mupirocin  ointment to assist with potential secondary bacterial infection prevention.  ED and return precautions reviewed and provided  in AVS.  Follow-up as needed.   Discharge Instructions      Please continue to use your Valtrex  for HSV flare. I am sending in a script for an antibiotic ointment to apply to the area twice per day for 7 days to help prevent bacterial infection.  If you develop any of the following please return to urgent care or follow up with your PCP: swelling of the penis, difficulty urinating, fever, drainage that looks like pus, bleeding.      ED Prescriptions     Medication Sig Dispense Auth. Provider   mupirocin  ointment (BACTROBAN ) 2 % Apply 1 Application topically 2 (two) times daily for 7 days. 14 g Wen Munford E, PA-C      PDMP not reviewed this encounter.   Marylene Rocky BRAVO, PA-C 08/19/24 1836

## 2024-08-19 NOTE — Discharge Instructions (Addendum)
 Please continue to use your Valtrex  for HSV flare. I am sending in a script for an antibiotic ointment to apply to the area twice per day for 7 days to help prevent bacterial infection.  If you develop any of the following please return to urgent care or follow up with your PCP: swelling of the penis, difficulty urinating, fever, drainage that looks like pus, bleeding.

## 2024-08-19 NOTE — ED Triage Notes (Addendum)
 Pt presents with complaints of skin problem in groin area x 6 days. Pt states there is pain in the area, it comes and goes. Currently rates as a 7/10. Swelling in the area, not healing properly. Believes it may be an ulcer that has formed but is unsure. Pt is anxious in triage. States he gets tested for STD's on a regular basis, has not been sexually active recently. No medications taken/applied PTA for symptoms.

## 2024-08-20 ENCOUNTER — Telehealth: Payer: Self-pay | Admitting: Emergency Medicine

## 2024-08-20 NOTE — Telephone Encounter (Signed)
 Pt called asking about dressing lesion with antibiotic ointment. Advised him to allow lesion to breathe. He could place gauze loosely in underwear or use a feminine pad.
